# Patient Record
Sex: Female | Born: 1955
Health system: Southern US, Community
[De-identification: ages and names within clinical notes are randomized; demographics above are authoritative.]

## PROBLEM LIST (undated history)

## (undated) DIAGNOSIS — N6452 Nipple discharge: Secondary | ICD-10-CM

## (undated) DIAGNOSIS — K219 Gastro-esophageal reflux disease without esophagitis: Secondary | ICD-10-CM

## (undated) DIAGNOSIS — E739 Lactose intolerance, unspecified: Secondary | ICD-10-CM

## (undated) DIAGNOSIS — M797 Fibromyalgia: Secondary | ICD-10-CM

## (undated) DIAGNOSIS — E785 Hyperlipidemia, unspecified: Principal | ICD-10-CM

## (undated) HISTORY — DX: Nipple discharge: N64.52

## (undated) HISTORY — DX: Hyperlipidemia, unspecified: E78.5

## (undated) HISTORY — DX: Fibromyalgia: M79.7

## (undated) HISTORY — DX: Lactose intolerance, unspecified: E73.9

## (undated) HISTORY — DX: Gastro-esophageal reflux disease without esophagitis: K21.9

## (undated) HISTORY — PX: TONSILECTOMY, ADENOIDECTOMY, BILATERAL MYRINGOTOMY AND TUBES: SHX2538

## (undated) HISTORY — PX: BREAST SURGERY: SHX581

## (undated) HISTORY — PX: HEMANGIOMA EXCISION: SHX1734

## (undated) HISTORY — PX: LIVER BIOPSY: SHX301

## (undated) HISTORY — PX: OTHER SURGICAL HISTORY: SHX169

---

## 2008-09-22 HISTORY — PX: BREAST SURGERY: SHX581

## 2012-01-27 LAB — HM COLONOSCOPY

## 2014-06-07 LAB — HM PAP SMEAR: HM PAP: NEGATIVE

## 2015-10-01 ENCOUNTER — Ambulatory Visit: Payer: Self-pay | Admitting: Family Medicine

## 2015-10-09 ENCOUNTER — Ambulatory Visit (INDEPENDENT_AMBULATORY_CARE_PROVIDER_SITE_OTHER): Payer: Commercial Managed Care - HMO | Admitting: Family Medicine

## 2015-10-09 ENCOUNTER — Encounter: Payer: Self-pay | Admitting: Family Medicine

## 2015-10-09 VITALS — BP 160/88 | HR 74 | Ht 64.5 in | Wt 168.0 lb

## 2015-10-09 DIAGNOSIS — E559 Vitamin D deficiency, unspecified: Secondary | ICD-10-CM | POA: Insufficient documentation

## 2015-10-09 DIAGNOSIS — R413 Other amnesia: Secondary | ICD-10-CM | POA: Diagnosis not present

## 2015-10-09 DIAGNOSIS — E785 Hyperlipidemia, unspecified: Secondary | ICD-10-CM

## 2015-10-09 HISTORY — DX: Hyperlipidemia, unspecified: E78.5

## 2015-10-09 LAB — VITAMIN B12: Vitamin B-12: 329 pg/mL (ref 211–911)

## 2015-10-09 LAB — TSH: TSH: 1.887 u[IU]/mL (ref 0.350–4.500)

## 2015-10-09 NOTE — Addendum Note (Signed)
Addended by: Marcial Pacas on: 10/09/2015 10:07 AM   Modules accepted: Orders

## 2015-10-09 NOTE — Progress Notes (Signed)
CC: Amanda Novak is a 60 y.o. female is here for Establish Care   Subjective: HPI:     Amanda Novak 60 year old here to establish care, mother of Amanda Novak  Complains of memory loss described further as long-term memory loss. She gives examples like Forgetting most memories that she has of her children's childhood. She denies any short-term memory loss. Family history significant for mother with Alzheimer's disease. Patient reports symptoms are currently moderate in severity present on a daily basis. She denies any recent or remote head trauma. No interventions as of yet. Symptoms have been present for the last years. Nothing seems to make symptoms better or worse.  She is remote history of vitamin D deficiency. She is currently taking 5000 units of vitamin D on a daily basis. She also tells me she has a history of vitamin B12 deficiency. Review systems is positive for tingling and itching in both lower extremities for matter of years to a mild degree. Symptoms have not improved with compression stockings. She denies any anxiety or depression or any other mental disturbance.  Review of Systems - General ROS: negative for - chills, fever, night sweats, weight gain or weight loss Ophthalmic ROS: negative for - decreased vision Psychological ROS: negative for - anxiety or depression ENT ROS: negative for - hearing change, nasal congestion, tinnitus or allergies Hematological and Lymphatic ROS: negative for - bleeding problems, bruising or swollen lymph nodes Breast ROS: negative Respiratory ROS: no cough, shortness of breath, or wheezing Cardiovascular ROS: no chest pain or dyspnea on exertion Gastrointestinal ROS: no abdominal pain, change in bowel habits, or black or bloody stools Genito-Urinary ROS: negative for - genital discharge, genital ulcers, incontinence or abnormal bleeding from genitals Musculoskeletal ROS: negative for - joint pain or muscle pain Neurological ROS: negative for  - headaches Dermatological ROS: negative for lumps, mole changes, rash and skin lesion changes  Past Medical History  Diagnosis Date  . Hyperlipidemia 10/09/2015    No past surgical history on file. No family history on file.  Social History   Social History  . Marital Status: Married    Spouse Name: N/A  . Number of Children: N/A  . Years of Education: N/A   Occupational History  . Not on file.   Social History Main Topics  . Smoking status: Never Smoker   . Smokeless tobacco: Not on file  . Alcohol Use: Not on file  . Drug Use: Not on file  . Sexual Activity: Not on file   Other Topics Concern  . Not on file   Social History Narrative  . No narrative on file     Objective: BP 160/88 mmHg  Pulse 74  Ht 5' 4.5" (1.638 m)  Wt 168 lb (76.204 kg)  BMI 28.40 kg/m2  SpO2 96%  Vital signs reviewed. General: Alert and Oriented, No Acute Distress HEENT: Pupils equal, round, reactive to light. Conjunctivae clear.  External ears unremarkable.  Moist mucous membranes. Lungs: Clear and comfortable work of breathing, speaking in full sentences without accessory muscle use. Cardiac: Regular rate and rhythm.  Neuro: CN II-XII grossly intact, gait normal. Extremities: No peripheral edema.  Strong peripheral pulses.  Mental Status: No depression, anxiety, nor agitation. Logical though process. Skin: Warm and dry.  MMSE score of 29/30, she got Tuesday and Wednesday mixed up.  Assessment & Plan: Danina was seen today for establish care.  Diagnoses and all orders for this visit:  Hyperlipidemia -     Vitamin B12 -  TSH -     Hemoglobin A1c -     VITAMIN D 25 Hydroxy (Vit-D Deficiency, Fractures)  Vitamin D deficiency -     Vitamin B12 -     TSH -     Hemoglobin A1c -     VITAMIN D 25 Hydroxy (Vit-D Deficiency, Fractures)  Memory loss -     Vitamin B12 -     TSH -     Hemoglobin A1c -     VITAMIN D 25 Hydroxy (Vit-D Deficiency, Fractures)   Hyperlipidemia:  She is due for repeat lipid panel however is not fasting today she'll need to do this at upcoming CPE. Vitamin D deficiency: checking vitamin D today. Memory loss: Low suspicion for Alzheimer's disease given that her memory loss is more long-term as opposed to short-term memory loss. Checking for metabolic abnormalities with the labs above. She has a history of hyperglycemia.   Return if symptoms worsen or fail to improve.

## 2015-10-10 ENCOUNTER — Telehealth: Payer: Self-pay | Admitting: Family Medicine

## 2015-10-10 DIAGNOSIS — R7303 Prediabetes: Secondary | ICD-10-CM

## 2015-10-10 DIAGNOSIS — E538 Deficiency of other specified B group vitamins: Secondary | ICD-10-CM

## 2015-10-10 LAB — LIPID PANEL
CHOLESTEROL: 262 mg/dL — AB (ref 125–200)
HDL: 42 mg/dL — AB (ref >=46)
LDL Cholesterol: 182 mg/dL — ABNORMAL HIGH (ref <130)
TRIGLYCERIDES: 190 mg/dL — AB (ref <150)
Total CHOL/HDL Ratio: 6.2 Ratio — ABNORMAL HIGH (ref <=5.0)
VLDL: 38 mg/dL — ABNORMAL HIGH (ref <30)

## 2015-10-10 LAB — VITAMIN D 25 HYDROXY (VIT D DEFICIENCY, FRACTURES): VIT D 25 HYDROXY: 38 ng/mL (ref 30–100)

## 2015-10-10 LAB — HEMOGLOBIN A1C
Hgb A1c MFr Bld: 6 % — ABNORMAL HIGH (ref <5.7)
Mean Plasma Glucose: 126 mg/dL — ABNORMAL HIGH (ref <117)

## 2015-10-10 MED ORDER — VITAMIN D (ERGOCALCIFEROL) 1.25 MG (50000 UNIT) PO CAPS: 50000.0000 [IU] | ORAL_CAPSULE | ORAL | Status: DC

## 2015-10-10 NOTE — Telephone Encounter (Signed)
Pt advised.

## 2015-10-10 NOTE — Telephone Encounter (Signed)
Will you please let patient know that her Vitamin D and B12 levels were both relatively low. I would recommend starting a weekly vitamin D supplement that I'll print off for faxing or pickup.  Also I'd recommend a monthly Vitamin B12 injection that she can receive via a nurse visit (added to med list).  Also her 3 month average blood sugar was in the pre-diabetes range.  Fortunately this does not require any new medication but it warrants cutting back on carbohydrates and rechecking it in three months.

## 2015-10-11 ENCOUNTER — Telehealth: Payer: Self-pay | Admitting: Family Medicine

## 2015-10-11 MED ORDER — ATORVASTATIN CALCIUM 20 MG PO TABS: 20.0000 mg | ORAL_TABLET | Freq: Every day | ORAL | Status: DC

## 2015-10-11 NOTE — Telephone Encounter (Signed)
Pt notified  Pt reports that she was once told that she have a fatty liver.

## 2015-10-11 NOTE — Telephone Encounter (Signed)
Will you please let patient know that her LDL was very elevated at 182 with a goal of less than 100.  I'd recommend starting a cholesterol lowering medication called atorvastatin that I'll print off. We'll want to recheck this in three months.

## 2015-10-15 LAB — HEPATIC FUNCTION PANEL
ALT: 37 U/L — AB (ref 7–35)
AST: 27 U/L (ref 13–35)
Alkaline Phosphatase: 159 U/L — AB (ref 25–125)

## 2015-10-15 LAB — CBC AND DIFFERENTIAL: HEMOGLOBIN: 13.6 g/dL (ref 12.0–16.0)

## 2015-10-16 ENCOUNTER — Ambulatory Visit: Payer: Commercial Managed Care - HMO

## 2015-10-17 ENCOUNTER — Ambulatory Visit (INDEPENDENT_AMBULATORY_CARE_PROVIDER_SITE_OTHER): Payer: Commercial Managed Care - HMO | Admitting: Family Medicine

## 2015-10-17 VITALS — BP 141/98 | HR 65 | Wt 166.0 lb

## 2015-10-17 DIAGNOSIS — E538 Deficiency of other specified B group vitamins: Secondary | ICD-10-CM

## 2015-10-17 DIAGNOSIS — Z299 Encounter for prophylactic measures, unspecified: Secondary | ICD-10-CM

## 2015-10-17 MED ORDER — CYANOCOBALAMIN 1000 MCG/ML IJ SOLN
1000.0000 ug | Freq: Once | INTRAMUSCULAR | Status: AC
  Administered 2015-10-17: 1000 ug via INTRAMUSCULAR

## 2015-10-17 NOTE — Progress Notes (Signed)
Patient was in for B12 injection. 1 ml Cyanocobalamin was given left deltoid. Patient only had a complaint with her blood pressure being high the last several visits she stated that she will make an appointment to follow up on it. Please advise also patient stated that she is not taking Lipitor at this time. Angelyna Henderson,CMA

## 2015-11-15 ENCOUNTER — Encounter: Payer: Self-pay | Admitting: Family Medicine

## 2015-11-15 DIAGNOSIS — K219 Gastro-esophageal reflux disease without esophagitis: Secondary | ICD-10-CM | POA: Insufficient documentation

## 2015-11-15 DIAGNOSIS — K7581 Nonalcoholic steatohepatitis (NASH): Secondary | ICD-10-CM | POA: Insufficient documentation

## 2015-11-15 DIAGNOSIS — D1809 Hemangioma of other sites: Secondary | ICD-10-CM | POA: Insufficient documentation

## 2015-11-19 ENCOUNTER — Encounter: Payer: Self-pay | Admitting: Family Medicine

## 2015-11-19 ENCOUNTER — Ambulatory Visit (INDEPENDENT_AMBULATORY_CARE_PROVIDER_SITE_OTHER): Payer: Commercial Managed Care - HMO | Admitting: Family Medicine

## 2015-11-19 VITALS — BP 145/87 | HR 59 | Wt 168.0 lb

## 2015-11-19 DIAGNOSIS — E785 Hyperlipidemia, unspecified: Secondary | ICD-10-CM

## 2015-11-19 DIAGNOSIS — Z0000 Encounter for general adult medical examination without abnormal findings: Secondary | ICD-10-CM | POA: Diagnosis not present

## 2015-11-19 MED ORDER — GABAPENTIN 400 MG PO CAPS: 400.0000 mg | ORAL_CAPSULE | Freq: Three times a day (TID) | ORAL | Status: DC

## 2015-11-19 MED ORDER — CYANOCOBALAMIN 1000 MCG/ML IJ SOLN
1000.0000 ug | Freq: Once | INTRAMUSCULAR | Status: AC
  Administered 2015-11-19: 1000 ug via INTRAMUSCULAR

## 2015-11-19 NOTE — Progress Notes (Signed)
CC: [name] is a 60 y.o. female is here for Annual Exam   Subjective: HPI:  Colonoscopyup-to-date Papsmear:Normal 2015 Mammogram: Repeat June 2017  Influenza Vaccine: Up-to-date Pneumovax: no current medication Td/Tdap: UTD from 2014 Zoster: (Start 59 yo)   Requesting complete physical exam with no complaints  Review of Systems - General ROS: negative for - chills, fever, night sweats, weight gain or weight loss Ophthalmic ROS: negative for - decreased vision Psychological ROS: negative for - anxiety or depression ENT ROS: negative for - hearing change, nasal congestion, tinnitus or allergies Hematological and Lymphatic ROS: negative for - bleeding problems, bruising or swollen lymph nodes Breast ROS: negative Respiratory ROS: no cough, shortness of breath, or wheezing Cardiovascular ROS: no chest pain or dyspnea on exertion Gastrointestinal ROS: no abdominal pain, change in bowel habits, or black or bloody stools Genito-Urinary ROS: negative for - genital discharge, genital ulcers, incontinence or abnormal bleeding from genitals Musculoskeletal ROS: negative for - joint pain or muscle pain Neurological ROS: negative for - headaches or memory loss Dermatological ROS: negative for lumps, mole changes, rash and skin lesion changes  Past Medical History  Diagnosis Date  . Hyperlipidemia 10/09/2015  . Fibromyalgia   . GERD (gastroesophageal reflux disease)   . Lactose intolerance   . Nipple discharge     Past Surgical History  Procedure Laterality Date  . Tubal lig    . Breast surgery     No family history on file.  Social History   Social History  . Marital Status: Married    Spouse Name: N/A  . Number of Children: N/A  . Years of Education: N/A   Occupational History  . Not on file.   Social History Main Topics  . Smoking status: Never Smoker   . Smokeless tobacco: Not on file  . Alcohol Use: Not on file  . Drug Use: Not on file  . Sexual Activity:  Not on file   Other Topics Concern  . Not on file   Social History Narrative     Objective: BP 145/87 mmHg  Pulse 59  Wt 168 lb (76.204 kg)  General: No Acute Distress HEENT: Atraumatic, normocephalic, conjunctivae normal without scleral icterus.  No nasal discharge, hearing grossly intact, TMs with good landmarks bilaterally with no middle ear abnormalities, posterior pharynx clear without oral lesions. Neck: Supple, trachea midline, no cervical nor supraclavicular adenopathy. Pulmonary: Clear to auscultation bilaterally without wheezing, rhonchi, nor rales. Cardiac: Regular rate and rhythm.  No murmurs, rubs, nor gallops. No peripheral edema.  2+ peripheral pulses bilaterally. Abdomen: Bowel sounds normal.  No masses.  Non-tender without rebound.  Negative Murphy's sign. MSK: Grossly intact, no signs of weakness.  Full strength throughout upper and lower extremities.  Full ROM in upper and lower extremities.  No midline spinal tenderness. Neuro: Gait unremarkable, CN II-XII grossly intact.  C5-C6 Reflex 2/4 Bilaterally, L4 Reflex 2/4 Bilaterally.  Cerebellar function intact. Skin: No rashes.multiple noninflamed seborrheic keratoses on the back Psych: Alert and oriented to person/place/time.  Thought process normal. No anxiety/depression.   Assessment & Plan: Amanda Novak was seen today for annual exam.  Diagnoses and all orders for this visit:  Hyperlipidemia  Annual physical exam -     CBC -     COMPLETE METABOLIC PANEL WITH GFR -     gabapentin (NEURONTIN) 400 MG capsule; Take 1 capsule (400 mg total) by mouth 3 (three) times daily.  Preventative health care -     COMPLETE METABOLIC PANEL WITH  GFR -     gabapentin (NEURONTIN) 400 MG capsule; Take 1 capsule (400 mg total) by mouth 3 (three) times daily. -     cyanocobalamin ((VITAMIN B-12)) injection 1,000 mcg; Inject 1 mL (1,000 mcg total) into the muscle once.   Healthy lifestyle interventions including but not limited to  regular exercise, a healthy low fat diet, moderation of salt intake, the dangers of tobacco/alcohol/recreational drug use, nutrition supplementation, and accident avoidance were discussed with the patient and a handout was provided for future reference.  Return in about 3 months (around 02/16/2016) for BP and Sugar.

## 2015-11-20 LAB — COMPLETE METABOLIC PANEL WITH GFR
ALBUMIN: 4.2 g/dL (ref 3.6–5.1)
ALK PHOS: 147 U/L — AB (ref 33–130)
ALT: 29 U/L (ref 6–29)
AST: 24 U/L (ref 10–35)
BUN: 14 mg/dL (ref 7–25)
CALCIUM: 9.5 mg/dL (ref 8.6–10.4)
CO2: 26 mmol/L (ref 20–31)
Chloride: 106 mmol/L (ref 98–110)
Creat: 0.93 mg/dL (ref 0.50–1.05)
GFR, EST NON AFRICAN AMERICAN: 68 mL/min (ref >=60)
GFR, Est African American: 78 mL/min (ref >=60)
Glucose, Bld: 85 mg/dL (ref 65–99)
POTASSIUM: 3.9 mmol/L (ref 3.5–5.3)
SODIUM: 142 mmol/L (ref 135–146)
Total Bilirubin: 0.7 mg/dL (ref 0.2–1.2)
Total Protein: 6.8 g/dL (ref 6.1–8.1)

## 2015-11-20 LAB — CBC
HCT: 36.7 % (ref 36.0–46.0)
HEMOGLOBIN: 12 g/dL (ref 12.0–15.0)
MCH: 29.6 pg (ref 26.0–34.0)
MCHC: 32.7 g/dL (ref 30.0–36.0)
MCV: 90.6 fL (ref 78.0–100.0)
MPV: 9.8 fL (ref 8.6–12.4)
Platelets: 298 10*3/uL (ref 150–400)
RBC: 4.05 MIL/uL (ref 3.87–5.11)
RDW: 14.1 % (ref 11.5–15.5)
WBC: 8.2 10*3/uL (ref 4.0–10.5)

## 2015-11-21 ENCOUNTER — Ambulatory Visit (INDEPENDENT_AMBULATORY_CARE_PROVIDER_SITE_OTHER): Payer: 59 | Admitting: Family Medicine

## 2015-11-21 VITALS — BP 133/64 | HR 64 | Wt 170.0 lb

## 2015-11-21 DIAGNOSIS — Z23 Encounter for immunization: Secondary | ICD-10-CM

## 2015-11-21 DIAGNOSIS — Z111 Encounter for screening for respiratory tuberculosis: Secondary | ICD-10-CM

## 2015-11-21 NOTE — Progress Notes (Signed)
Pt came into clinic today for PPD placement. Pt does have two forms that need to be completed by her PCP. Will put these in PCP's box to be ready for pick up with Pt returns for her PPD read. Pt tolerated PPD placement in left forearm well, no immediate complications. Pt scheduled her PPD read prior to leaving clinic today. No further questions.

## 2015-11-22 ENCOUNTER — Encounter: Payer: Self-pay | Admitting: Family Medicine

## 2015-11-22 ENCOUNTER — Telehealth: Payer: Self-pay

## 2015-11-22 NOTE — Telephone Encounter (Signed)
Opened in error

## 2015-11-23 ENCOUNTER — Ambulatory Visit (INDEPENDENT_AMBULATORY_CARE_PROVIDER_SITE_OTHER): Payer: 59 | Admitting: Family Medicine

## 2015-11-23 VITALS — BP 131/71 | HR 75

## 2015-11-23 DIAGNOSIS — Z111 Encounter for screening for respiratory tuberculosis: Secondary | ICD-10-CM

## 2015-11-23 LAB — TB SKIN TEST
Induration: 0 mm
TB SKIN TEST: NEGATIVE

## 2015-11-23 NOTE — Progress Notes (Signed)
   Subjective:    Patient ID: Amanda Novak, female    DOB: 31-Aug-1956, 60 y.o.   MRN: [id number]  HPI  Amanda Novak is here for a PPD reading.   Review of Systems     Objective:   Physical Exam        Assessment & Plan:  PPD - negative with 0 mm

## 2015-12-01 ENCOUNTER — Emergency Department
Admission: EM | Admit: 2015-12-01 | Discharge: 2015-12-01 | Disposition: A | Discharge status: Home / Self Care | Payer: 59 | Source: Home / Self Care | Attending: Family Medicine | Admitting: Family Medicine

## 2015-12-01 ENCOUNTER — Encounter: Payer: Self-pay | Admitting: Emergency Medicine

## 2015-12-01 DIAGNOSIS — J329 Chronic sinusitis, unspecified: Secondary | ICD-10-CM

## 2015-12-01 DIAGNOSIS — J069 Acute upper respiratory infection, unspecified: Secondary | ICD-10-CM

## 2015-12-01 LAB — POCT INFLUENZA A/B
Influenza A, POC: NEGATIVE
Influenza B, POC: NEGATIVE

## 2015-12-01 MED ORDER — AMOXICILLIN-POT CLAVULANATE 875-125 MG PO TABS: 1.0000 | ORAL_TABLET | Freq: Two times a day (BID) | ORAL | Status: DC

## 2015-12-01 NOTE — ED Notes (Signed)
Patient presents to [facility] requesting a flu test symptoms times 7 days. Cough cold congestions, headache dizziness, body aches, fatigue

## 2015-12-01 NOTE — ED Provider Notes (Signed)
CSN: [id number]     Arrival date & time 12/01/15  1046 History   First MD Initiated Contact with Patient 12/01/15 1111     Chief Complaint  Patient presents with  . Nasal Congestion  . Cough   (Consider location/radiation/quality/duration/timing/severity/associated sxs/prior Treatment) HPI  The pt is a 60yo female presenting to [facility] with c/o flu-like symptoms for 7 days and wants to be tested for the flu.  She has been around several other people who have been sick.  She has had cough, congestion, generalized headache with dizziness, body aches, and fatigue.  Sinus pressure and congestion is most bothersome for her. Symptoms are moderate in severity. No relief with OTC tylenol. Denies difficulty breathing or swallowing. Denies n/vd.   Past Medical History  Diagnosis Date  . Hyperlipidemia 10/09/2015  . Fibromyalgia   . GERD (gastroesophageal reflux disease)   . Lactose intolerance   . Nipple discharge    Past Surgical History  Procedure Laterality Date  . Tubal lig    . Breast surgery     History reviewed. No pertinent family history. Social History  Substance Use Topics  . Smoking status: Never Smoker   . Smokeless tobacco: None  . Alcohol Use: None   OB History    No data available     Review of Systems  Constitutional: Negative for fever and chills.  HENT: Positive for congestion, postnasal drip, rhinorrhea, sinus pressure, sneezing and sore throat. Negative for ear pain, trouble swallowing and voice change.   Respiratory: Positive for cough. Negative for shortness of breath.   Cardiovascular: Negative for chest pain and palpitations.  Gastrointestinal: Negative for nausea, vomiting, abdominal pain and diarrhea.  Musculoskeletal: Positive for myalgias and arthralgias. Negative for back pain.  Skin: Negative for rash.  Neurological: Positive for dizziness and headaches. Negative for light-headedness.    Allergies  Tramadol; Codeine; and Hydrocodone  Home Medications    Prior to Admission medications   Medication Sig Start Date End Date Taking? Authorizing Provider  amoxicillin-clavulanate (AUGMENTIN) 875-125 MG tablet Take 1 tablet by mouth 2 (two) times daily. One po bid x 7 days 12/01/15   Noland Fordyce, PA-C  atorvastatin (LIPITOR) 20 MG tablet Take 1 tablet (20 mg total) by mouth daily. 10/11/15   Sean Hommel, DO  cyanocobalamin (,VITAMIN B-12,) 1000 MCG/ML injection 1037mcg injection IM every month in the clinic. 10/10/15   Marcial Pacas, DO  esomeprazole (NEXIUM) 40 MG capsule  05/08/14   Historical Provider, MD  gabapentin (NEURONTIN) 400 MG capsule Take 1 capsule (400 mg total) by mouth 3 (three) times daily. 11/19/15   Marcial Pacas, DO  neomycin-polymyxin b-dexamethasone (MAXITROL) 3.5-10000-0.1 SUSP  11/14/15   Historical Provider, MD  pantoprazole (PROTONIX) 40 MG tablet Take 40 tablets by mouth daily. 11/15/15   Historical Provider, MD  Vitamin D, Ergocalciferol, (DRISDOL) 50000 units CAPS capsule Take 1 capsule (50,000 Units total) by mouth every 7 (seven) days. Recheck Vitamin D in 3 Months 10/10/15   Marcial Pacas, DO   Meds Ordered and Administered this Visit  Medications - No data to display  BP 161/85 mmHg  Pulse 83  Temp(Src) 98.3 F (36.8 C) (Oral)  Resp 16  Ht 5\' 4"  (1.626 m)  Wt 169 lb 12 oz (76.998 kg)  BMI 29.12 kg/m2  SpO2 96% No data found.   Physical Exam  Constitutional: She appears well-developed and well-nourished. No distress.  HENT:  Head: Normocephalic and atraumatic.  Right Ear: A middle ear effusion is present.  Left Ear: Tympanic membrane normal.  Nose: Mucosal edema and rhinorrhea present. Right sinus exhibits maxillary sinus tenderness and frontal sinus tenderness. Left sinus exhibits maxillary sinus tenderness and frontal sinus tenderness.  Mouth/Throat: Uvula is midline, oropharynx is clear and moist and mucous membranes are normal.  Eyes: Conjunctivae are normal. No scleral icterus.  Neck: Normal range of motion.  Neck supple.  Cardiovascular: Normal rate, regular rhythm and normal heart sounds.   Pulmonary/Chest: Effort normal and breath sounds normal. No respiratory distress. She has no wheezes. She has no rales. She exhibits no tenderness.  Abdominal: Soft. She exhibits no distension. There is no tenderness.  Musculoskeletal: Normal range of motion.  Neurological: She is alert.  Skin: Skin is warm and dry. She is not diaphoretic.  Nursing note and vitals reviewed.   ED Course  Procedures (including critical care time)  Labs Review Labs Reviewed - No data to display  Imaging Review No results found.    MDM   1. Rhinosinusitis   2. Acute upper respiratory infection    Pt c/o flu-like symptoms for 1 week, worsening sinus pressure and pain.  Rapid flu- negative  Exam c/w sinus infection. Will tx with antibiotics given severity of pain. Rx: augmentin  Advised pt to use acetaminophen and ibuprofen as needed for fever and pain. Encouraged rest and fluids. F/u with PCP in 7-10 days if not improving, sooner if worsening. Pt verbalized understanding and agreement with tx plan.    Noland Fordyce, PA-C 12/01/15 1252

## 2015-12-01 NOTE — Discharge Instructions (Signed)
You may take 400-600mg  Ibuprofen (Motrin) every 6-8 hours for fever and pain  Alternate with Tylenol  You may take 500mg  Tylenol every 4-6 hours as needed for fever and pain  Follow-up with your primary care provider next week for recheck of symptoms if not improving.  Be sure to drink plenty of fluids and rest, at least 8hrs of sleep a night, preferably more while you are sick. Return urgent care or go to closest ER if you cannot keep down fluids/signs of dehydration, fever not reducing with Tylenol, difficulty breathing/wheezing, stiff neck, worsening condition, or other concerns (see below)  Please take antibiotics as prescribed and be sure to complete entire course even if you start to feel better to ensure infection does not come back.   Cool Mist Vaporizers Vaporizers may help relieve the symptoms of a cough and cold. They add moisture to the air, which helps mucus to become thinner and less sticky. This makes it easier to breathe and cough up secretions. Cool mist vaporizers do not cause serious burns like hot mist vaporizers, which may also be called steamers or humidifiers. Vaporizers have not been proven to help with colds. You should not use a vaporizer if you are allergic to mold. HOME CARE INSTRUCTIONS  Follow the package instructions for the vaporizer.  Do not use anything other than distilled water in the vaporizer.  Do not run the vaporizer all of the time. This can cause mold or bacteria to grow in the vaporizer.  Clean the vaporizer after each time it is used.  Clean and dry the vaporizer well before storing it.  Stop using the vaporizer if worsening respiratory symptoms develop.   This information is not intended to replace advice given to you by your health care provider. Make sure you discuss any questions you have with your health care provider.   Document Released: 06/05/2004 Document Revised: 09/13/2013 Document Reviewed: 01/26/2013 Elsevier Interactive Patient  Education 2016 [employer].  Sinusitis, Adult Sinusitis is redness, soreness, and puffiness (inflammation) of the air pockets in the bones of your face (sinuses). The redness, soreness, and puffiness can cause air and mucus to get trapped in your sinuses. This can allow germs to grow and cause an infection.  HOME CARE   Drink enough fluids to keep your pee (urine) clear or pale yellow.  Use a humidifier in your home.  Run a hot shower to create steam in the bathroom. Sit in the bathroom with the door closed. Breathe in the steam 3-4 times a day.  Put a warm, moist washcloth on your face 3-4 times a day, or as told by your doctor.  Use salt water sprays (saline sprays) to wet the thick fluid in your nose. This can help the sinuses drain.  Only take medicine as told by your doctor. GET HELP RIGHT AWAY IF:   Your pain gets worse.  You have very bad headaches.  You are sick to your stomach (nauseous).  You throw up (vomit).  You are very sleepy (drowsy) all the time.  Your face is puffy (swollen).  Your vision changes.  You have a stiff neck.  You have trouble breathing. MAKE SURE YOU:   Understand these instructions.  Will watch your condition.  Will get help right away if you are not doing well or get worse.   This information is not intended to replace advice given to you by your health care provider. Make sure you discuss any questions you have with your health  care provider.   Document Released: 02/25/2008 Document Revised: 09/29/2014 Document Reviewed: 04/13/2012 Elsevier Interactive Patient Education [employer].

## 2015-12-17 ENCOUNTER — Ambulatory Visit (INDEPENDENT_AMBULATORY_CARE_PROVIDER_SITE_OTHER): Payer: 59 | Admitting: Family Medicine

## 2015-12-17 ENCOUNTER — Ambulatory Visit (INDEPENDENT_AMBULATORY_CARE_PROVIDER_SITE_OTHER): Payer: 59

## 2015-12-17 ENCOUNTER — Encounter: Payer: Self-pay | Admitting: Family Medicine

## 2015-12-17 VITALS — BP 147/92 | HR 64 | Temp 98.3°F | Wt 170.0 lb

## 2015-12-17 DIAGNOSIS — D539 Nutritional anemia, unspecified: Secondary | ICD-10-CM

## 2015-12-17 DIAGNOSIS — R0782 Intercostal pain: Secondary | ICD-10-CM

## 2015-12-17 DIAGNOSIS — R05 Cough: Secondary | ICD-10-CM

## 2015-12-17 DIAGNOSIS — R059 Cough, unspecified: Secondary | ICD-10-CM

## 2015-12-17 MED ORDER — PREDNISONE 20 MG PO TABS: ORAL_TABLET | ORAL | Status: AC

## 2015-12-17 MED ORDER — LEVOFLOXACIN 500 MG PO TABS: 500.0000 mg | ORAL_TABLET | Freq: Every day | ORAL | Status: DC

## 2015-12-17 MED ORDER — CYANOCOBALAMIN 1000 MCG/ML IJ SOLN
1000.0000 ug | Freq: Once | INTRAMUSCULAR | Status: AC
  Administered 2015-12-17: 1000 ug via INTRAMUSCULAR

## 2015-12-17 NOTE — Progress Notes (Signed)
CC: Amanda Novak is a 60 y.o. female is here for Sinusitis   Subjective: HPI:  Cough ever since the beginning of March it's mildly productive, there is no blood in the sputum. It's been worsening on a weekly basis despite getting Augmentin back on the 11th for treatment of suspected sinusitis. She continues to have nasal congestion, postnasal drip and frequent coughing. Chest pain occurs while coughing but absent when resting. There is no other exertional component to her chest pain. She's had some wheezing when lying down but no subjective rhonchi. She denies shortness of breath. She had fever over the weekend but was controlled with Tylenol. No benefit from Allegra-D, Zyrtec, nor Augmentin.she had diarrhea but stopped after she stopped Augmentin   Review Of Systems Outlined In HPI  Past Medical History  Diagnosis Date  . Hyperlipidemia 10/09/2015  . Fibromyalgia   . GERD (gastroesophageal reflux disease)   . Lactose intolerance   . Nipple discharge     Past Surgical History  Procedure Laterality Date  . Tubal lig    . Breast surgery     No family history on file.  Social History   Social History  . Marital Status: Married    Spouse Name: N/A  . Number of Children: N/A  . Years of Education: N/A   Occupational History  . Not on file.   Social History Main Topics  . Smoking status: Never Smoker   . Smokeless tobacco: Not on file  . Alcohol Use: Not on file  . Drug Use: Not on file  . Sexual Activity: Not on file   Other Topics Concern  . Not on file   Social History Narrative     Objective: BP 147/92 mmHg  Pulse 64  Temp(Src) 98.3 F (36.8 C) (Oral)  Wt 170 lb (77.111 kg)  SpO2 99%  General: Alert and Oriented, No Acute Distress HEENT: Pupils equal, round, reactive to light. Conjunctivae clear.  External ears unremarkable, canals clear with intact TMs with appropriate landmarks.  Middle ear appears open without effusion. Pink inferior turbinates.  Moist  mucous membranes, pharynx without inflammation nor lesions.  Neck supple without palpable lymphadenopathy nor abnormal masses. Lungs: Clear to auscultation bilaterally, no wheezing/ronchi/rales.  Comfortable work of breathing. Good air movement. Cardiac: Regular rate and rhythm. Normal S1/S2.  No murmurs, rubs, nor gallops.   Extremities: No peripheral edema.  Strong peripheral pulses.  Mental Status: No depression, anxiety, nor agitation. Skin: Warm and dry.  Assessment & Plan: Amanda Novak was seen today for sinusitis.  Diagnoses and all orders for this visit:  Cough -     DG Chest 2 View  Intercostal pain -     DG Chest 2 View   X-RAY OBTAINED AND WAS REASSURING AND UNREMARKABLE. pATIENT IS IN AGREEMENT WITH TREATMENT PLAN INCLUDING COMBINATION OF PREDNISONE AND lEVAQUIN. i'VE ASKED HER TO CALL ME IF SHE IS NOT FEELING BETTER BY wEDNESDAY.   No Follow-up on file.

## 2015-12-18 ENCOUNTER — Ambulatory Visit: Payer: 59

## 2015-12-19 ENCOUNTER — Other Ambulatory Visit: Payer: Self-pay

## 2015-12-19 MED ORDER — VITAMIN D (ERGOCALCIFEROL) 1.25 MG (50000 UNIT) PO CAPS: 50000.0000 [IU] | ORAL_CAPSULE | ORAL | Status: DC

## 2015-12-27 ENCOUNTER — Encounter: Payer: Self-pay | Admitting: Family Medicine

## 2015-12-27 ENCOUNTER — Ambulatory Visit (INDEPENDENT_AMBULATORY_CARE_PROVIDER_SITE_OTHER): Payer: 59 | Admitting: Family Medicine

## 2015-12-27 VITALS — BP 129/57 | HR 77 | Temp 99.1°F | Ht 64.25 in | Wt 169.0 lb

## 2015-12-27 DIAGNOSIS — R300 Dysuria: Secondary | ICD-10-CM | POA: Diagnosis not present

## 2015-12-27 DIAGNOSIS — N760 Acute vaginitis: Secondary | ICD-10-CM | POA: Diagnosis not present

## 2015-12-27 DIAGNOSIS — R6889 Other general symptoms and signs: Secondary | ICD-10-CM | POA: Diagnosis not present

## 2015-12-27 DIAGNOSIS — R05 Cough: Secondary | ICD-10-CM | POA: Diagnosis not present

## 2015-12-27 DIAGNOSIS — R0981 Nasal congestion: Secondary | ICD-10-CM

## 2015-12-27 DIAGNOSIS — R55 Syncope and collapse: Secondary | ICD-10-CM

## 2015-12-27 DIAGNOSIS — R748 Abnormal levels of other serum enzymes: Secondary | ICD-10-CM | POA: Diagnosis not present

## 2015-12-27 DIAGNOSIS — R059 Cough, unspecified: Secondary | ICD-10-CM

## 2015-12-27 LAB — POCT URINALYSIS DIPSTICK
BILIRUBIN UA: NEGATIVE
Blood, UA: NEGATIVE
Glucose, UA: NEGATIVE
KETONES UA: NEGATIVE
NITRITE UA: NEGATIVE
PROTEIN UA: NEGATIVE
Spec Grav, UA: 1.015
Urobilinogen, UA: 0.2
pH, UA: 7

## 2015-12-27 MED ORDER — BENZONATATE 200 MG PO CAPS: 200.0000 mg | ORAL_CAPSULE | Freq: Two times a day (BID) | ORAL | Status: DC | PRN

## 2015-12-27 MED ORDER — PREDNISONE 20 MG PO TABS: ORAL_TABLET | ORAL | Status: DC
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
## 2015-12-28 ENCOUNTER — Encounter: Payer: Self-pay | Admitting: Family Medicine

## 2015-12-28 LAB — WET PREP, GENITAL
TRICH WET PREP: NONE SEEN
Yeast Wet Prep HPF POC: NONE SEEN

## 2015-12-28 LAB — CBC WITH DIFFERENTIAL/PLATELET
BASOS ABS: 0 {cells}/uL (ref 0–200)
Basophils Relative: 0 %
EOS ABS: 61 {cells}/uL (ref 15–500)
Eosinophils Relative: 1 %
HEMATOCRIT: 39.4 % (ref 35.0–45.0)
HEMOGLOBIN: 13 g/dL (ref 11.7–15.5)
Lymphocytes Relative: 44 %
Lymphs Abs: 2684 cells/uL (ref 850–3900)
MCH: 29.2 pg (ref 27.0–33.0)
MCHC: 33 g/dL (ref 32.0–36.0)
MCV: 88.5 fL (ref 80.0–100.0)
MONO ABS: 549 {cells}/uL (ref 200–950)
MONOS PCT: 9 %
MPV: 10 fL (ref 7.5–12.5)
Neutro Abs: 2806 cells/uL (ref 1500–7800)
Neutrophils Relative %: 46 %
PLATELETS: 211 10*3/uL (ref 140–400)
RBC: 4.45 MIL/uL (ref 3.80–5.10)
RDW: 14.1 % (ref 11.0–15.0)
WBC: 6.1 10*3/uL (ref 3.8–10.8)

## 2015-12-28 LAB — COMPLETE METABOLIC PANEL WITH GFR
ALT: 28 U/L (ref 6–29)
AST: 20 U/L (ref 10–35)
Albumin: 4 g/dL (ref 3.6–5.1)
Alkaline Phosphatase: 117 U/L (ref 33–130)
BILIRUBIN TOTAL: 0.9 mg/dL (ref 0.2–1.2)
BUN: 14 mg/dL (ref 7–25)
CHLORIDE: 103 mmol/L (ref 98–110)
CO2: 25 mmol/L (ref 20–31)
CREATININE: 0.91 mg/dL (ref 0.50–1.05)
Calcium: 8.8 mg/dL (ref 8.6–10.4)
GFR, EST AFRICAN AMERICAN: 80 mL/min (ref >=60)
GFR, Est Non African American: 70 mL/min (ref >=60)
GLUCOSE: 87 mg/dL (ref 65–99)
Potassium: 3.9 mmol/L (ref 3.5–5.3)
SODIUM: 140 mmol/L (ref 135–146)
TOTAL PROTEIN: 6.7 g/dL (ref 6.1–8.1)

## 2015-12-28 LAB — GAMMA GT: GGT: 43 U/L (ref 7–51)

## 2015-12-28 MED ORDER — METRONIDAZOLE 500 MG PO TABS: 500.0000 mg | ORAL_TABLET | Freq: Two times a day (BID) | ORAL | Status: DC

## 2015-12-28 NOTE — Addendum Note (Signed)
Addended by: Beatrice Lecher D on: 12/28/2015 08:00 AM   Modules accepted: Orders

## 2015-12-29 LAB — URINE CULTURE
Colony Count: NO GROWTH
Organism ID, Bacteria: NO GROWTH

## 2016-01-01 ENCOUNTER — Telehealth: Payer: Self-pay

## 2016-01-01 NOTE — Telephone Encounter (Signed)
If she is feeling tremendously better then can hold off on CT of sinses.

## 2016-01-02 NOTE — Telephone Encounter (Signed)
Patient advised.

## 2016-01-23 ENCOUNTER — Ambulatory Visit (INDEPENDENT_AMBULATORY_CARE_PROVIDER_SITE_OTHER): Payer: Commercial Managed Care - HMO | Admitting: Family Medicine

## 2016-01-23 VITALS — BP 127/79 | HR 103

## 2016-01-23 DIAGNOSIS — D510 Vitamin B12 deficiency anemia due to intrinsic factor deficiency: Secondary | ICD-10-CM

## 2016-01-23 MED ORDER — CYANOCOBALAMIN 1000 MCG/ML IJ SOLN
1000.0000 ug | Freq: Once | INTRAMUSCULAR | Status: AC
Start: 2016-01-23 — End: 2016-01-23
  Administered 2016-01-23: 1000 ug via INTRAMUSCULAR

## 2016-01-23 NOTE — Progress Notes (Signed)
Amanda Novak is here for a vitamin B 12 injection. Denies muscle cramps, weakness or irregular heart rate.   Patient tolerated injection well without complications. Patient advised to schedule next injection 30 days from today.

## 2016-01-25 ENCOUNTER — Encounter: Payer: Self-pay | Admitting: Emergency Medicine

## 2016-01-25 ENCOUNTER — Emergency Department
Admission: EM | Admit: 2016-01-25 | Discharge: 2016-01-25 | Disposition: A | Discharge status: Home / Self Care | Payer: Commercial Managed Care - HMO | Source: Home / Self Care | Attending: Emergency Medicine | Admitting: Emergency Medicine

## 2016-01-25 DIAGNOSIS — J0120 Acute ethmoidal sinusitis, unspecified: Secondary | ICD-10-CM

## 2016-01-25 MED ORDER — PREDNISONE 20 MG PO TABS: ORAL_TABLET | ORAL | Status: DC

## 2016-01-25 MED ORDER — LEVOFLOXACIN 500 MG PO TABS: 500.0000 mg | ORAL_TABLET | Freq: Every day | ORAL | Status: DC

## 2016-01-25 NOTE — Discharge Instructions (Signed)

## 2016-01-25 NOTE — ED Notes (Signed)
Reports 7 week history of intermittent sinus problems; most recent episode started 4 days ago and involves the area anterior of left ear.

## 2016-01-25 NOTE — ED Provider Notes (Signed)
CSN: [id number]     Arrival date & time 01/25/16  1837 History   First MD Initiated Contact with Patient 01/25/16 1853     Chief Complaint  Patient presents with  . Facial Pain   (Consider location/radiation/quality/duration/timing/severity/associated sxs/prior Treatment) Patient is a 60 y.o. female presenting with cough. The history is provided by the patient. No language interpreter was used.  Cough Cough characteristics:  Productive Sputum characteristics:  Clear Severity:  Moderate Onset quality:  Gradual Duration:  4 days Timing:  Constant Progression:  Worsening Chronicity:  New Smoker: no   Relieved by:  Nothing Worsened by:  Nothing tried Ineffective treatments:  None tried Associated symptoms: ear pain, headaches, rhinorrhea and sinus congestion   Risk factors: recent infection   Pt reports she has had sinus infections on and off for 7 weeks.  Pt has been seen by Dr. Madilyn Fireman and Dr. Ileene Rubens.   Pt reports she was told she needed a ct but she got better.  Pt was last treated with prednisone and levaquin.    Past Medical History  Diagnosis Date  . Hyperlipidemia 10/09/2015  . Fibromyalgia   . GERD (gastroesophageal reflux disease)   . Lactose intolerance   . Nipple discharge    Past Surgical History  Procedure Laterality Date  . Tubal lig    . Breast surgery     History reviewed. No pertinent family history. Social History  Substance Use Topics  . Smoking status: Never Smoker   . Smokeless tobacco: None  . Alcohol Use: None   OB History    No data available     Review of Systems  HENT: Positive for ear pain and rhinorrhea.   Respiratory: Positive for cough.   Neurological: Positive for headaches.  All other systems reviewed and are negative.   Allergies  Tramadol; Codeine; Hydrocodone; and Zyrtec  Home Medications   Prior to Admission medications   Medication Sig Start Date End Date Taking? Authorizing Provider  atorvastatin (LIPITOR) 20 MG tablet  Take 1 tablet (20 mg total) by mouth daily. 10/11/15   Sean Hommel, DO  benzonatate (TESSALON) 200 MG capsule Take 1 capsule (200 mg total) by mouth 2 (two) times daily as needed for cough. 12/27/15   Hali Marry, MD  cyanocobalamin (,VITAMIN B-12,) 1000 MCG/ML injection 1076mg injection IM every month in the clinic. 10/10/15   Sean Hommel, DO  gabapentin (NEURONTIN) 400 MG capsule Take 1 capsule (400 mg total) by mouth 3 (three) times daily. 11/19/15   Sean Hommel, DO  levofloxacin (LEVAQUIN) 500 MG tablet Take 1 tablet (500 mg total) by mouth daily. 01/25/16   LFransico Meadow PA-C  metroNIDAZOLE (FLAGYL) 500 MG tablet Take 1 tablet (500 mg total) by mouth 2 (two) times daily. 12/28/15   CHali Marry MD  pantoprazole (PROTONIX) 40 MG tablet Take 40 tablets by mouth daily. 11/15/15   Historical Provider, MD  predniSONE (DELTASONE) 20 MG tablet 2 tabs PO QD x 5 days, then 1 tab PO QD x 5 days. 01/25/16   LFransico Meadow PA-C  Vitamin D, Ergocalciferol, (DRISDOL) 50000 units CAPS capsule Take 1 capsule (50,000 Units total) by mouth every 7 (seven) days. Recheck Vitamin D in 3 Months 12/19/15   SMarcial Pacas DO   Meds Ordered and Administered this Visit  Medications - No data to display  BP 131/71 mmHg  Pulse 79  Temp(Src) 98.4 F (36.9 C) (Oral)  Resp 16  Ht 5' 4"  (1.626 m)  Wt  169 lb (76.658 kg)  BMI 28.99 kg/m2  SpO2 98% No data found.   Physical Exam  Constitutional: She is oriented to person, place, and time. She appears well-developed and well-nourished.  HENT:  Head: Normocephalic and atraumatic.  Right Ear: External ear normal.  Left Ear: External ear normal.  Nose: Nose normal.  Mouth/Throat: Oropharynx is clear and moist.  Tender maxillary sinuses,  Fluid  bilat tm's.  No erythema,    Eyes: Conjunctivae and EOM are normal. Pupils are equal, round, and reactive to light.  Neck: Normal range of motion. Neck supple.  Cardiovascular: Normal rate.   Pulmonary/Chest: Effort  normal.  Abdominal: Soft. She exhibits no distension.  Musculoskeletal: Normal range of motion.  Neurological: She is alert and oriented to person, place, and time.  Skin: Skin is warm and dry.  Psychiatric: She has a normal mood and affect.  Nursing note and vitals reviewed.   ED Course  Procedures (including critical care time)  Labs Review Labs Reviewed - No data to display  Imaging Review No results found.   Visual Acuity Review  Right Eye Distance:   Left Eye Distance:   Bilateral Distance:    Right Eye Near:   Left Eye Near:    Bilateral Near:         MDM Pt advised to call Dr. Lajoyce Lauber office on Monday to discuss Ct scan.  I will treat with levaquin and prednisone as this worked last time.   Pt as hemangioma's.  Pt concerned about hemagioma's and levaquin.  (Levaquin reported to have possible tendon rupture but no literature on effects to hemangioma's.)  Pt counseled on risk associated with Levaquin and all medications   1. Subacute ethmoidal sinusitis    Meds ordered this encounter  Medications  . DISCONTD: predniSONE (DELTASONE) 20 MG tablet    Sig: 2 tabs PO QD x 5 days, then 1 tab PO QD x 5 days.    Dispense:  15 tablet    Refill:  0    Order Specific Question:  Supervising Provider    Answer:  Burnett Harry, DAVID [[id number]]  . DISCONTD: levofloxacin (LEVAQUIN) 500 MG tablet    Sig: Take 1 tablet (500 mg total) by mouth daily.    Dispense:  10 tablet    Refill:  0    Order Specific Question:  Supervising Provider    Answer:  Burnett Harry, DAVID [[id number]]  . predniSONE (DELTASONE) 20 MG tablet    Sig: 2 tabs PO QD x 5 days, then 1 tab PO QD x 5 days.    Dispense:  15 tablet    Refill:  0    Order Specific Question:  Supervising Provider    Answer:  Burnett Harry, DAVID [[id number]]  . levofloxacin (LEVAQUIN) 500 MG tablet    Sig: Take 1 tablet (500 mg total) by mouth daily.    Dispense:  10 tablet    Refill:  0    Order Specific Question:  Supervising Provider    Answer:   Burnett Harry, DAVID [[id number]]  An After Visit Summary was printed and given to the patient.    Lucan, PA-C 01/25/16 1927

## 2016-01-27 ENCOUNTER — Telehealth: Payer: Self-pay | Admitting: Emergency Medicine

## 2016-02-04 ENCOUNTER — Ambulatory Visit (INDEPENDENT_AMBULATORY_CARE_PROVIDER_SITE_OTHER): Payer: Commercial Managed Care - HMO

## 2016-02-04 DIAGNOSIS — R0981 Nasal congestion: Secondary | ICD-10-CM

## 2016-02-04 DIAGNOSIS — R059 Cough, unspecified: Secondary | ICD-10-CM

## 2016-02-04 DIAGNOSIS — R05 Cough: Secondary | ICD-10-CM

## 2016-02-11 ENCOUNTER — Ambulatory Visit: Payer: 59 | Admitting: Family Medicine

## 2016-02-21 ENCOUNTER — Ambulatory Visit: Payer: Commercial Managed Care - HMO

## 2016-02-22 ENCOUNTER — Ambulatory Visit (INDEPENDENT_AMBULATORY_CARE_PROVIDER_SITE_OTHER): Payer: Commercial Managed Care - HMO | Admitting: Family Medicine

## 2016-02-22 VITALS — BP 137/71 | HR 82

## 2016-02-22 DIAGNOSIS — D510 Vitamin B12 deficiency anemia due to intrinsic factor deficiency: Secondary | ICD-10-CM

## 2016-02-22 MED ORDER — CYANOCOBALAMIN 1000 MCG/ML IJ SOLN
1000.0000 ug | Freq: Once | INTRAMUSCULAR | Status: AC
  Administered 2016-02-22: 1000 ug via INTRAMUSCULAR

## 2016-02-22 NOTE — Progress Notes (Signed)
Successful Injection

## 2016-04-10 ENCOUNTER — Encounter: Payer: Self-pay | Admitting: Family Medicine

## 2016-04-10 ENCOUNTER — Ambulatory Visit (INDEPENDENT_AMBULATORY_CARE_PROVIDER_SITE_OTHER): Payer: Commercial Managed Care - HMO | Admitting: Family Medicine

## 2016-04-10 VITALS — BP 122/69 | HR 62

## 2016-04-10 DIAGNOSIS — E538 Deficiency of other specified B group vitamins: Secondary | ICD-10-CM | POA: Diagnosis not present

## 2016-04-10 MED ORDER — CYANOCOBALAMIN 1000 MCG/ML IJ SOLN
1000.0000 ug | Freq: Once | INTRAMUSCULAR | Status: AC
  Administered 2016-04-10: 1000 ug via INTRAMUSCULAR

## 2016-04-10 NOTE — Progress Notes (Signed)
   Subjective:    Patient ID: Amanda Novak, female    DOB: Feb 17, 1956, 60 y.o.   MRN: [id number]  HPI  Shalae presents to clinic for her monthly b 12 injection.  Denies weakness, irregular heart rates, and muscle cramps.  Review of Systems     Objective:   Physical Exam        Assessment & Plan:  Pt tolerated injection in the right deltoid well without any complications. Pt advised to make next appointment in 30 days. -[facility]/RMA

## 2016-05-12 ENCOUNTER — Ambulatory Visit (INDEPENDENT_AMBULATORY_CARE_PROVIDER_SITE_OTHER): Payer: Commercial Managed Care - HMO | Admitting: Family Medicine

## 2016-05-12 VITALS — BP 120/81 | HR 70 | Ht 64.0 in | Wt 171.0 lb

## 2016-05-12 DIAGNOSIS — E538 Deficiency of other specified B group vitamins: Secondary | ICD-10-CM | POA: Diagnosis not present

## 2016-05-12 MED ORDER — CYANOCOBALAMIN 1000 MCG/ML IJ SOLN
1000.0000 ug | INTRAMUSCULAR | Status: DC
  Administered 2016-05-12: 1000 ug via INTRAMUSCULAR

## 2016-05-12 NOTE — Progress Notes (Signed)
Patient was in office for B 12 injection. 1 ml of Cyanocobalamin was given left deltoid. Patient denied any concerns or side effects. Patient scheduled appointment for next injection. Rhonda Cunningham,CMA

## 2016-06-09 ENCOUNTER — Encounter: Payer: Self-pay | Admitting: Physician Assistant

## 2016-06-09 ENCOUNTER — Ambulatory Visit (INDEPENDENT_AMBULATORY_CARE_PROVIDER_SITE_OTHER): Payer: Commercial Managed Care - HMO | Admitting: Physician Assistant

## 2016-06-09 VITALS — BP 127/63 | HR 78 | Temp 98.3°F | Wt 170.0 lb

## 2016-06-09 DIAGNOSIS — E538 Deficiency of other specified B group vitamins: Secondary | ICD-10-CM

## 2016-06-09 DIAGNOSIS — Z23 Encounter for immunization: Secondary | ICD-10-CM

## 2016-06-09 MED ORDER — CYANOCOBALAMIN 1000 MCG/ML IJ SOLN
1000.0000 ug | Freq: Once | INTRAMUSCULAR | Status: AC
  Administered 2016-06-09: 1000 ug via INTRAMUSCULAR

## 2016-06-09 NOTE — Progress Notes (Signed)
Patient is here for a B12 injection. B12 injection administered without complication in right deltoid.  Patient also requests a flu shot. She did mention she is coming for an appt tomorrow to be seen for a possible sinus infection but has not had a fever within the last 24 hours that she is aware of.

## 2016-06-10 ENCOUNTER — Ambulatory Visit (INDEPENDENT_AMBULATORY_CARE_PROVIDER_SITE_OTHER): Payer: Commercial Managed Care - HMO | Admitting: Physician Assistant

## 2016-06-10 ENCOUNTER — Encounter: Payer: Self-pay | Admitting: Physician Assistant

## 2016-06-10 VITALS — BP 131/75 | HR 80 | Ht 64.0 in | Wt 168.0 lb

## 2016-06-10 DIAGNOSIS — J0100 Acute maxillary sinusitis, unspecified: Secondary | ICD-10-CM

## 2016-06-10 MED ORDER — AMOXICILLIN-POT CLAVULANATE 875-125 MG PO TABS: 1.0000 | ORAL_TABLET | Freq: Two times a day (BID) | ORAL | 0 refills | Status: DC

## 2016-06-10 NOTE — Patient Instructions (Signed)

## 2016-06-10 NOTE — Progress Notes (Signed)
   Subjective:    Patient ID: Amanda Novak, female    DOB: Jul 20, 1956, 60 y.o.   MRN: [id number]  HPI Pt is a 60 yo female who presents to the clinic with over 7 days of sinus pressure, left ear pain, nasal congestion. She has had upper respiratory symptoms for over 2 weeks. She is taking sudafed and mucinex with some relief. No fever, chills, body aches. She has a dry cough.    Review of Systems See HPI>     Objective:   Physical Exam  Constitutional: She is oriented to person, place, and time. She appears well-developed and well-nourished.  HENT:  Head: Normocephalic and atraumatic.  Right Ear: External ear normal.  Left Ear: External ear normal.  Mouth/Throat: Oropharynx is clear and moist.  TM's clear.  Bilateral nasal turbinates red and swollen.  Tenderness over maxillary sinuses to palpation.   Eyes: Conjunctivae are normal. Right eye exhibits no discharge. Left eye exhibits no discharge.  Neck: Normal range of motion. Neck supple.  Non-tender shotty lymph node enlargement anterior cervical.   Cardiovascular: Normal rate, regular rhythm and normal heart sounds.   Pulmonary/Chest: Effort normal and breath sounds normal.  Lymphadenopathy:    She has cervical adenopathy.  Neurological: She is alert and oriented to person, place, and time.  Psychiatric: She has a normal mood and affect. Her behavior is normal.          Assessment & Plan:  Acute maxillary sinusitis- sent augmentin for 10 days. Discussed claritin for allergies. Discussed flonase as needed. Other symptomatic care given. If not improving by Friday will consider medrol dose pak.

## 2016-07-07 ENCOUNTER — Ambulatory Visit: Payer: Commercial Managed Care - HMO

## 2016-07-25 ENCOUNTER — Other Ambulatory Visit: Payer: Self-pay | Admitting: *Deleted

## 2016-07-25 MED ORDER — VITAMIN D (ERGOCALCIFEROL) 1.25 MG (50000 UNIT) PO CAPS: 50000.0000 [IU] | ORAL_CAPSULE | ORAL | 0 refills | Status: DC

## 2016-07-29 ENCOUNTER — Ambulatory Visit (INDEPENDENT_AMBULATORY_CARE_PROVIDER_SITE_OTHER): Payer: Commercial Managed Care - HMO | Admitting: Physician Assistant

## 2016-07-29 VITALS — BP 146/76 | HR 82

## 2016-07-29 DIAGNOSIS — E538 Deficiency of other specified B group vitamins: Secondary | ICD-10-CM | POA: Diagnosis not present

## 2016-07-29 IMAGING — CT CT PARANASAL SINUSES LIMITED
1 series · 13 of 15 positions shown, 17 images · non-contrast
Comparison: None.

CLINICAL DATA: Pain across the high since [DATE] with sinus
pressure, congestion, cough. Assessment for chronic sinusitis.

EXAM:
CT PARANASAL SINUS LIMITED WITHOUT CONTRAST
TECHNIQUE: Non-contiguous multidetector CT images of the paranasal sinuses were
obtained in a single plane without contrast.

[Series 3: limited sinus st · axial · 0.30mm/px · z∈[-140,-20]mm · 13 of 15 slices shown, 17 images]
[im 2/15  brain]
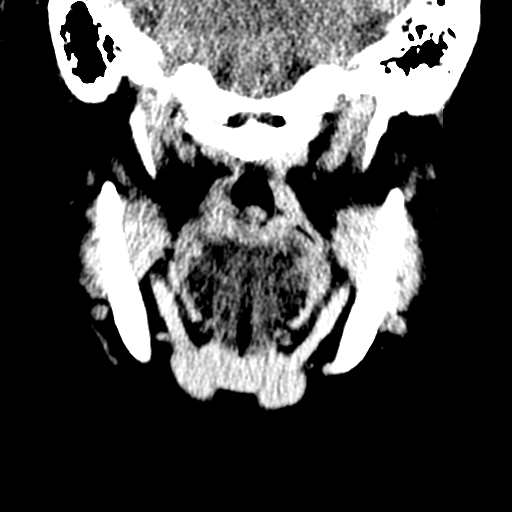
[im 2/15  bone]
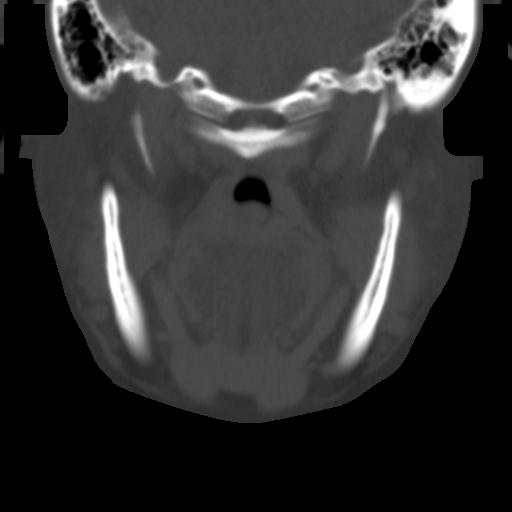
[im 3/15  bone]
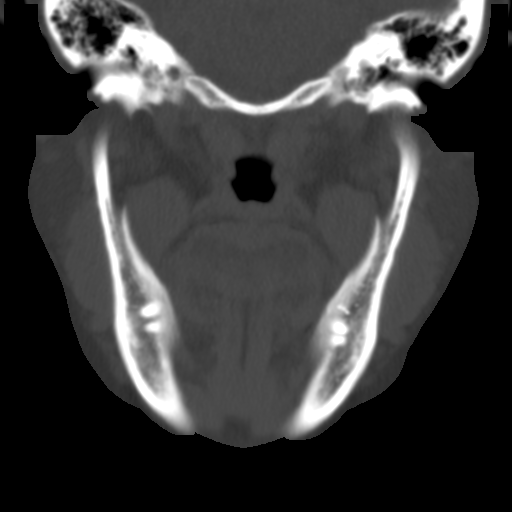
[im 4/15  bone]
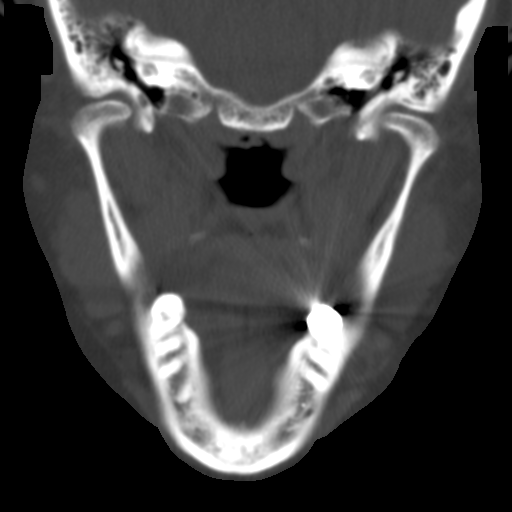
[im 5/15  bone]
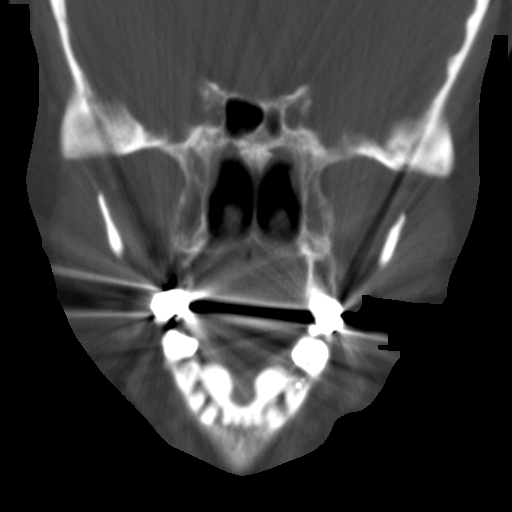
[im 6/15  brain]
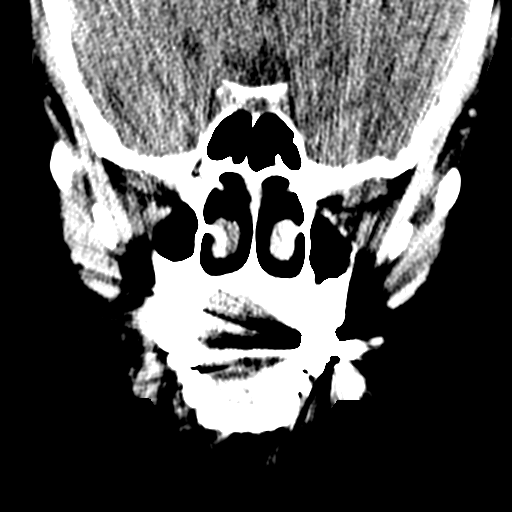
[im 6/15  bone]
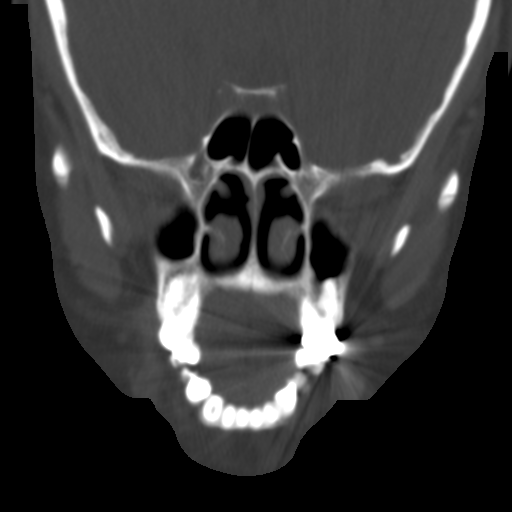
[im 7/15  bone]
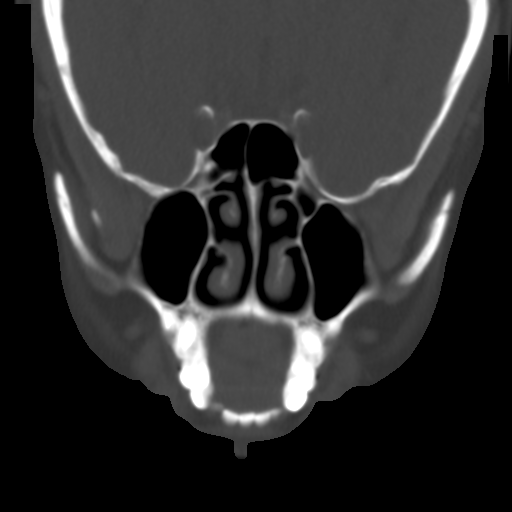
[im 8/15  bone]
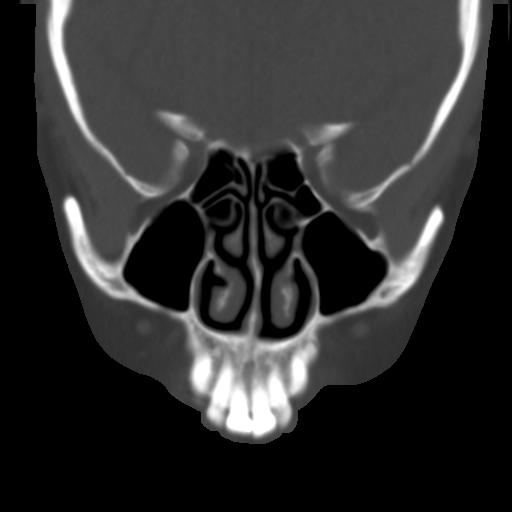
[im 9/15  bone]
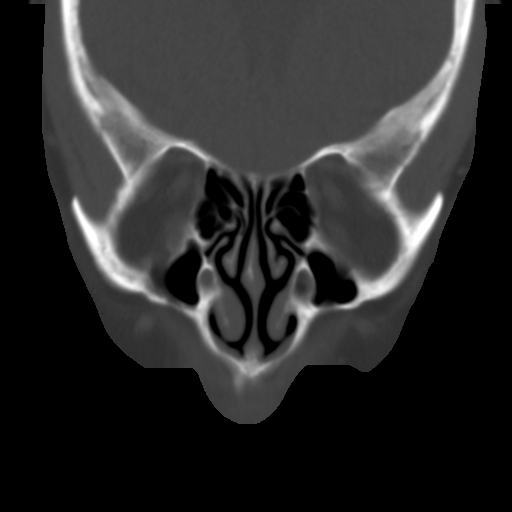
[im 10/15  brain]
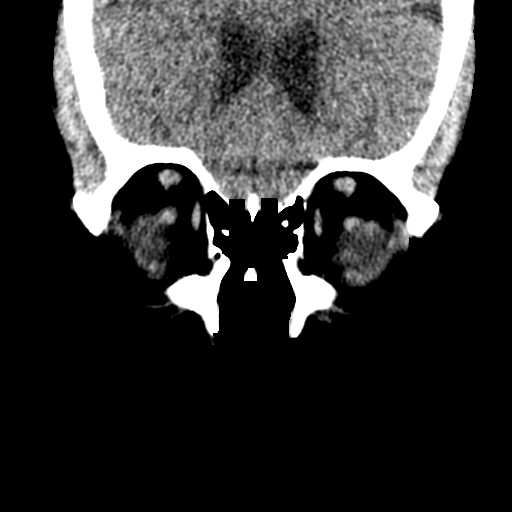
[im 10/15  bone]
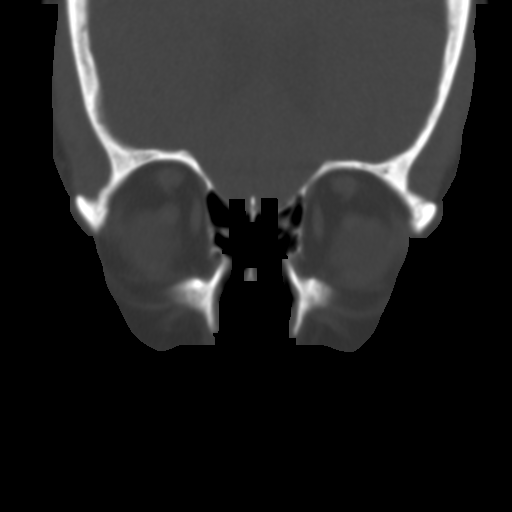
[im 11/15  bone]
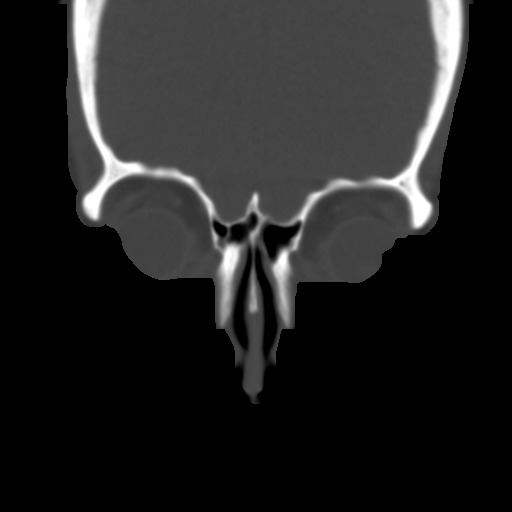
[im 12/15  bone]
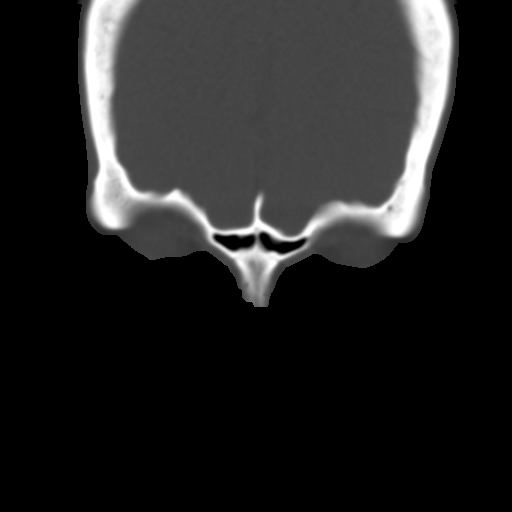
[im 13/15  bone]
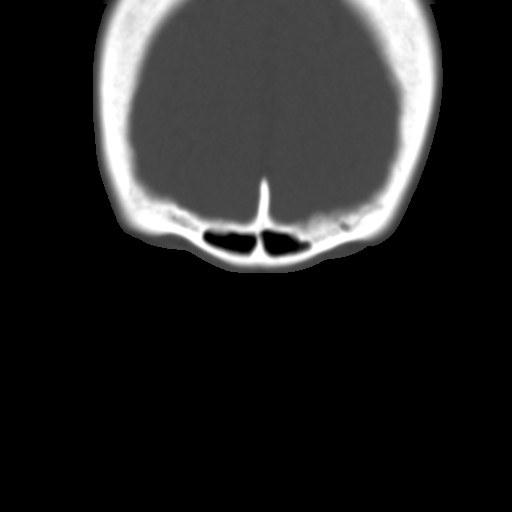
[im 14/15  brain]
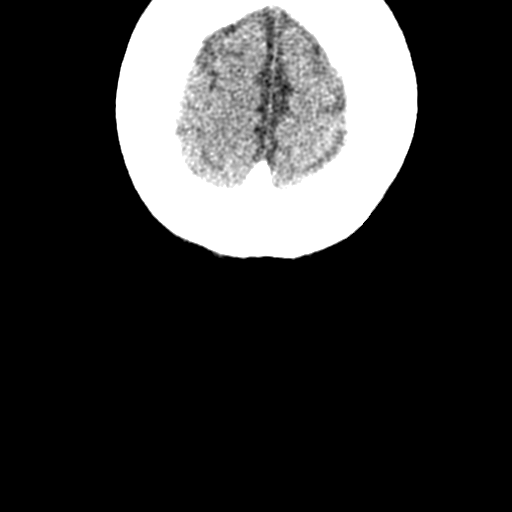
[im 14/15  bone]
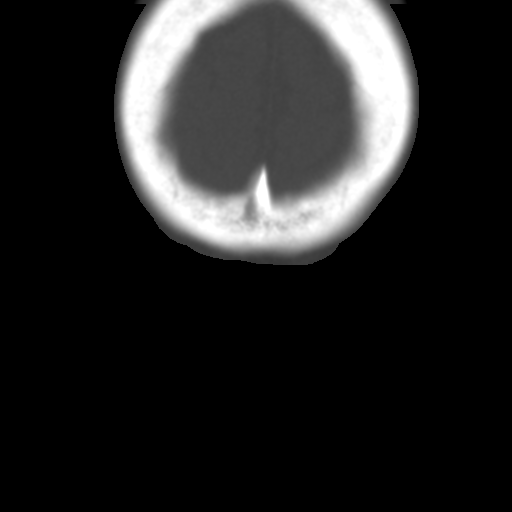

[13 of 15 positions shown; findings below may reference images not displayed]

FINDINGS: The visualized portions of the paranasal sinuses are clear without
evidence of significant mucosal thickening or fluid levels. There is
minimal S-shaped curvature of the nasal septum. The visualized
regional soft tissues are grossly unremarkable.
IMPRESSION: Clear sinuses.

## 2016-07-29 MED ORDER — CYANOCOBALAMIN 1000 MCG/ML IJ SOLN
1000.0000 ug | Freq: Once | INTRAMUSCULAR | Status: AC
  Administered 2016-07-29: 1000 ug via INTRAMUSCULAR

## 2016-07-29 NOTE — Progress Notes (Signed)
Pt is here for her monthly B 12 injection. Pt tolerated injection well in the left deltoid without any complications.   Call pt: need to get b12 checked and see what levels are at with supplementation. Iran Planas PA-C

## 2016-08-26 ENCOUNTER — Ambulatory Visit (INDEPENDENT_AMBULATORY_CARE_PROVIDER_SITE_OTHER): Payer: Commercial Managed Care - HMO | Admitting: Physician Assistant

## 2016-08-26 VITALS — BP 139/79 | HR 77

## 2016-08-26 DIAGNOSIS — Z1239 Encounter for other screening for malignant neoplasm of breast: Secondary | ICD-10-CM

## 2016-08-26 DIAGNOSIS — E538 Deficiency of other specified B group vitamins: Secondary | ICD-10-CM

## 2016-08-26 DIAGNOSIS — Z1231 Encounter for screening mammogram for malignant neoplasm of breast: Secondary | ICD-10-CM

## 2016-08-26 MED ORDER — CYANOCOBALAMIN 1000 MCG/ML IJ SOLN
1000.0000 ug | Freq: Once | INTRAMUSCULAR | Status: AC
  Administered 2016-08-26: 1000 ug via INTRAMUSCULAR

## 2016-08-26 NOTE — Progress Notes (Signed)
Patient came into clinic today for Vitamin B12 injection. Pt reports no palpitations or any other negative side effects from this medication. Did look over Pt's health maintenance, she is due for a mammogram. Pt would like to go to the breast center in [facility] since that is where she has had all prior mammograms. Will place order. Pt also reports she has had a Pap at the [facility] center in [facility]. Called and path report is being sent over. HM updated with date.

## 2016-09-12 ENCOUNTER — Ambulatory Visit (INDEPENDENT_AMBULATORY_CARE_PROVIDER_SITE_OTHER): Payer: Commercial Managed Care - HMO | Admitting: Physician Assistant

## 2016-09-12 ENCOUNTER — Encounter: Payer: Self-pay | Admitting: Physician Assistant

## 2016-09-12 VITALS — BP 134/79 | HR 86 | Ht 64.0 in | Wt 175.0 lb

## 2016-09-12 DIAGNOSIS — J0100 Acute maxillary sinusitis, unspecified: Secondary | ICD-10-CM

## 2016-09-12 MED ORDER — PREDNISONE 50 MG PO TABS: ORAL_TABLET | ORAL | 0 refills | Status: DC

## 2016-09-12 MED ORDER — AMOXICILLIN-POT CLAVULANATE 875-125 MG PO TABS: 1.0000 | ORAL_TABLET | Freq: Two times a day (BID) | ORAL | 0 refills | Status: DC

## 2016-09-12 NOTE — Patient Instructions (Signed)

## 2016-09-13 ENCOUNTER — Encounter: Payer: Self-pay | Admitting: Physician Assistant

## 2016-09-13 NOTE — Progress Notes (Signed)
   Subjective:    Patient ID: Amanda Novak, female    DOB: December 22, 1955, 60 y.o.   MRN: [id number]  HPI Pt is a 60 yo female with hx of allergies and sinusitis that presents to the clinic with one month of sinus pressure, nasal congestion, dull frontal headache, ear pressure, and dry cough. She has tried tylenol cold, sinus, severe with little relief. She cannot tolerate zyrtec and concerned about using anti-histamines to control allergies. No fever, chills, SOB, wheezing.    Review of Systems  All other systems reviewed and are negative.      Objective:   Physical Exam  Constitutional: She appears well-developed and well-nourished.  HENT:  Head: Normocephalic and atraumatic.  TM"s erythematous and dull. Dull light reflex.  Tenderness over maxillary sinuses to palpation.  Oropharynx erythematous with PND.  Bilateral nasal turbinates red and swollen.   Eyes: Conjunctivae are normal.  Neck: Normal range of motion. Neck supple.  Cardiovascular: Normal rate, regular rhythm and normal heart sounds.   Pulmonary/Chest: Effort normal and breath sounds normal.  Lymphadenopathy:    She has cervical adenopathy.  Skin: Skin is warm.          Assessment & Plan:  Marland KitchenMarland KitchenCarigan was seen today for headache.  Diagnoses and all orders for this visit:  Acute maxillary sinusitis, recurrence not specified -     amoxicillin-clavulanate (AUGMENTIN) 875-125 MG tablet; Take 1 tablet by mouth 2 (two) times daily. For 10 days. -     predniSONE (DELTASONE) 50 MG tablet; Take one tablet for 5 days.   Discussed symptomatic care. Due to symptoms ongoing for one month will treat for sinusitis. Consider trying claritin for allergy relief and prevention of sinus infections.

## 2016-09-24 ENCOUNTER — Ambulatory Visit: Payer: Commercial Managed Care - HMO

## 2016-10-13 ENCOUNTER — Other Ambulatory Visit: Payer: Self-pay

## 2016-10-13 DIAGNOSIS — E538 Deficiency of other specified B group vitamins: Secondary | ICD-10-CM

## 2016-10-13 DIAGNOSIS — E559 Vitamin D deficiency, unspecified: Secondary | ICD-10-CM

## 2016-10-13 DIAGNOSIS — R7303 Prediabetes: Secondary | ICD-10-CM

## 2016-10-13 DIAGNOSIS — E7849 Other hyperlipidemia: Secondary | ICD-10-CM

## 2016-10-15 ENCOUNTER — Other Ambulatory Visit: Payer: Self-pay | Admitting: Physician Assistant

## 2016-10-15 ENCOUNTER — Telehealth: Payer: Self-pay | Admitting: Physician Assistant

## 2016-10-15 DIAGNOSIS — Z1239 Encounter for other screening for malignant neoplasm of breast: Secondary | ICD-10-CM

## 2016-10-15 NOTE — Telephone Encounter (Signed)
Please call patient. LDL is elevated. Is patient taking lipitor?  TG increased a bit. Fish oil 4054m can help with this.  Alk phos(liver enzyme) up a bit was down last year. Recheck in 4 weeks. Are you drinking alcohol or taking more tylenol?  Continues to be pre-diabetic. Work on diet and exericse.  Vitamin d low start OTC D3  20013mdaily after completin 50,000 unit prescription.  b12 great.

## 2016-10-20 NOTE — Telephone Encounter (Signed)
Ok lets just check cholesterol and vitamin D in 4 months to see how lipitor and vitamin d are working. Do you need any refills do get you to recheck?

## 2016-10-20 NOTE — Telephone Encounter (Signed)
Patient had not taken lipitor in a little while. She is also on vitamin d 50,000 units but she has not taken that in a while either. She just got the prescriptions refilled the other day . Also she sees a GI a specialist who follows her liver enzymes and she also even had a liver biopsy. Just FYI

## 2016-10-23 NOTE — Telephone Encounter (Signed)
Message left on vm 

## 2016-11-07 ENCOUNTER — Encounter: Payer: Self-pay | Admitting: Physician Assistant

## 2016-11-07 ENCOUNTER — Ambulatory Visit (INDEPENDENT_AMBULATORY_CARE_PROVIDER_SITE_OTHER): Payer: Commercial Managed Care - HMO | Admitting: Physician Assistant

## 2016-11-07 VITALS — BP 115/72 | HR 85 | Temp 98.3°F | Ht 64.0 in | Wt 173.0 lb

## 2016-11-07 DIAGNOSIS — B9789 Other viral agents as the cause of diseases classified elsewhere: Secondary | ICD-10-CM | POA: Diagnosis not present

## 2016-11-07 DIAGNOSIS — E538 Deficiency of other specified B group vitamins: Secondary | ICD-10-CM | POA: Diagnosis not present

## 2016-11-07 DIAGNOSIS — J069 Acute upper respiratory infection, unspecified: Secondary | ICD-10-CM | POA: Diagnosis not present

## 2016-11-07 DIAGNOSIS — J329 Chronic sinusitis, unspecified: Secondary | ICD-10-CM

## 2016-11-07 MED ORDER — CYANOCOBALAMIN 1000 MCG/ML IJ SOLN
1000.0000 ug | Freq: Once | INTRAMUSCULAR | Status: AC
  Administered 2016-11-07: 1000 ug via INTRAMUSCULAR

## 2016-11-07 MED ORDER — IPRATROPIUM BROMIDE 0.06 % NA SOLN: 2.0000 | Freq: Four times a day (QID) | NASAL | 2 refills | Status: DC

## 2016-11-07 MED ORDER — PREDNISONE 50 MG PO TABS: ORAL_TABLET | ORAL | 0 refills | Status: DC

## 2016-11-07 NOTE — Patient Instructions (Addendum)
Sinusitis, Adult Sinusitis is soreness and inflammation of your sinuses. Sinuses are hollow spaces in the bones around your face. They are located:  Around your eyes.  In the middle of your forehead.  Behind your nose.  In your cheekbones. Your sinuses and nasal passages are lined with a stringy fluid (mucus). Mucus normally drains out of your sinuses. When your nasal tissues get inflamed or swollen, the mucus can get trapped or blocked so air cannot flow through your sinuses. This lets bacteria, viruses, and funguses grow, and that leads to infection. Follow these instructions at home: Medicines  Take, use, or apply over-the-counter and prescription medicines only as told by your doctor. These may include nasal sprays.  If you were prescribed an antibiotic medicine, take it as told by your doctor. Do not stop taking the antibiotic even if you start to feel better. Hydrate and Humidify  Drink enough water to keep your pee (urine) clear or pale yellow.  Use a cool mist humidifier to keep the humidity level in your home above 50%.  Breathe in steam for 10-15 minutes, 3-4 times a day or as told by your doctor. You can do this in the bathroom while a hot shower is running.  Try not to spend time in cool or dry air. Rest  Rest as much as possible.  Sleep with your head raised (elevated).  Make sure to get enough sleep each night. General instructions  Put a warm, moist washcloth on your face 3-4 times a day or as told by your doctor. This will help with discomfort.  Wash your hands often with soap and water. If there is no soap and water, use hand sanitizer.  Do not smoke. Avoid being around people who are smoking (secondhand smoke).  Keep all follow-up visits as told by your doctor. This is important. Contact a doctor if:  You have a fever.  Your symptoms get worse.  Your symptoms do not get better within 10 days. Get help right away if:  You have a very bad  headache.  You cannot stop throwing up (vomiting).  You have pain or swelling around your face or eyes.  You have trouble seeing.  You feel confused.  Your neck is stiff.  You have trouble breathing. This information is not intended to replace advice given to you by your health care provider. Make sure you discuss any questions you have with your health care provider. Document Released: 02/25/2008 Document Revised: 05/04/2016 Document Reviewed: 07/04/2015 Elsevier Interactive Patient Education  2017 [employer] Upper Respiratory Infection, Adult Most upper respiratory infections (URIs) are caused by a virus. A URI affects the nose, throat, and upper air passages. The most common type of URI is often called "the common cold." Follow these instructions at home:  Take medicines only as told by your doctor.  Gargle warm saltwater or take cough drops to comfort your throat as told by your doctor.  Use a warm mist humidifier or inhale steam from a shower to increase air moisture. This may make it easier to breathe.  Drink enough fluid to keep your pee (urine) clear or pale yellow.  Eat soups and other clear broths.  Have a healthy diet.  Rest as needed.  Go back to work when your fever is gone or your doctor says it is okay.  You may need to stay home longer to avoid giving your URI to others.  You can also wear a face mask and wash your hands often  to prevent spread of the virus.  Use your inhaler more if you have asthma.  Do not use any tobacco products, including cigarettes, chewing tobacco, or electronic cigarettes. If you need help quitting, ask your doctor. Contact a doctor if:  You are getting worse, not better.  Your symptoms are not helped by medicine.  You have chills.  You are getting more short of breath.  You have brown or red mucus.  You have yellow or brown discharge from your nose.  You have pain in your face, especially when you bend  forward.  You have a fever.  You have puffy (swollen) neck glands.  You have pain while swallowing.  You have white areas in the back of your throat. Get help right away if:  You have very bad or constant:  Headache.  Ear pain.  Pain in your forehead, behind your eyes, and over your cheekbones (sinus pain).  Chest pain.  You have long-lasting (chronic) lung disease and any of the following:  Wheezing.  Long-lasting cough.  Coughing up blood.  A change in your usual mucus.  You have a stiff neck.  You have changes in your:  Vision.  Hearing.  Thinking.  Mood. This information is not intended to replace advice given to you by your health care provider. Make sure you discuss any questions you have with your health care provider. Document Released: 02/25/2008 Document Revised: 05/11/2016 Document Reviewed: 12/14/2013 Elsevier Interactive Patient Education  2017 [employer].

## 2016-11-07 NOTE — Progress Notes (Signed)
   Subjective:    Patient ID: Amanda Novak, female    DOB: June 02, 1956, 61 y.o.   MRN: [id number]  HPI  Pt is a 61 yo female who presents to the clinic with 5 days of low grade fever of 99.3, body aches, ear pressure, watery eyes. No SOB or wheezing. She is concerned about the flu. She would like to go to a funeral today but not if she has the flu.     Review of Systems  All other systems reviewed and are negative.      Objective:   Physical Exam  Constitutional: She is oriented to person, place, and time. She appears well-developed and well-nourished.  HENT:  Head: Normocephalic and atraumatic.  Right Ear: External ear normal.  Left Ear: External ear normal.  Nose: Nose normal.  Mouth/Throat: Oropharynx is clear and moist. No oropharyngeal exudate.  Tenderness over maxillary sinusitis.   Eyes: Conjunctivae are normal.  Clear watery discharge bilateral eyes. No redness or purulent discharge.   Neck: Normal range of motion. Neck supple.  Cardiovascular: Normal rate, regular rhythm and normal heart sounds.   Pulmonary/Chest: Effort normal and breath sounds normal. She has no wheezes.  Lymphadenopathy:    She has no cervical adenopathy.  Neurological: She is alert and oriented to person, place, and time.  Skin: Skin is warm and dry.          Assessment & Plan:  Marland KitchenMarland KitchenDiagnoses and all orders for this visit:  Upper respiratory infection, viral -     ipratropium (ATROVENT) 0.06 % nasal spray; Place 2 sprays into both nostrils 4 (four) times daily.  Vitamin B 12 deficiency -     cyanocobalamin ((VITAMIN B-12)) injection 1,000 mcg; Inject 1 mL (1,000 mcg total) into the muscle once.  Viral sinusitis -     ipratropium (ATROVENT) 0.06 % nasal spray; Place 2 sprays into both nostrils 4 (four) times daily. -     predniSONE (DELTASONE) 50 MG tablet; Take one tablet for 5 days.   Reassurance given.  Prednisone and atrovent given today.  Symptomatic care discussed with tylenol  cold and sinus. Encouraged sinus rinses.

## 2016-11-09 ENCOUNTER — Encounter: Payer: Self-pay | Admitting: Physician Assistant

## 2016-11-15 LAB — HM MAMMOGRAPHY

## 2016-11-28 ENCOUNTER — Encounter: Payer: Self-pay | Admitting: Physician Assistant

## 2016-12-02 ENCOUNTER — Encounter: Payer: Commercial Managed Care - HMO | Admitting: Family Medicine

## 2016-12-03 ENCOUNTER — Ambulatory Visit (INDEPENDENT_AMBULATORY_CARE_PROVIDER_SITE_OTHER): Payer: Commercial Managed Care - HMO | Admitting: Sports Medicine

## 2016-12-03 ENCOUNTER — Ambulatory Visit (INDEPENDENT_AMBULATORY_CARE_PROVIDER_SITE_OTHER): Payer: Commercial Managed Care - HMO

## 2016-12-03 ENCOUNTER — Encounter: Payer: Self-pay | Admitting: Sports Medicine

## 2016-12-03 DIAGNOSIS — M79644 Pain in right finger(s): Secondary | ICD-10-CM

## 2016-12-03 DIAGNOSIS — M65311 Trigger thumb, right thumb: Secondary | ICD-10-CM | POA: Insufficient documentation

## 2016-12-03 MED ORDER — IBUPROFEN 800 MG PO TABS: 800.0000 mg | ORAL_TABLET | Freq: Three times a day (TID) | ORAL | 2 refills | Status: DC | PRN

## 2016-12-03 NOTE — Assessment & Plan Note (Signed)
Pain at the base of the right first proximal phalanx/distal end of the first metacarpal. Thumb spica brace, ibuprofen 800, x-rays. Return in one month.

## 2016-12-03 NOTE — Progress Notes (Signed)
   Subjective:    I'm seeing this patient as a consultation for:  Amanda Planas, PA-C  CC: Right thumb pain  HPI: This is a pleasant 61 year old female, 2 half weeks ago she fell onto an outstretched right hand, since then she's had pain that she localizes on the volar first MCP, moderate, persistent without radiation, worse with gripping functions.  Past medical history:  Negative.  See flowsheet/record as well for more information.  Surgical history: Negative.  See flowsheet/record as well for more information.  Family history: Negative.  See flowsheet/record as well for more information.  Social history: Negative.  See flowsheet/record as well for more information.  Allergies, and medications have been entered into the medical record, reviewed, and no changes needed.   Review of Systems: No headache, visual changes, nausea, vomiting, diarrhea, constipation, dizziness, abdominal pain, skin rash, fevers, chills, night sweats, weight loss, swollen lymph nodes, body aches, joint swelling, muscle aches, chest pain, shortness of breath, mood changes, visual or auditory hallucinations.   Objective:   General: Well Developed, well nourished, and in no acute distress.  Neuro/Psych: Alert and oriented x3, extra-ocular muscles intact, able to move all 4 extremities, sensation grossly intact. Skin: Warm and dry, no rashes noted.  Respiratory: Not using accessory muscles, speaking in full sentences, trachea midline.  Cardiovascular: Pulses palpable, no extremity edema. Abdomen: Does not appear distended. Right hand: Tenderness the volar first MCP, collaterals are stable, good strength to flexion and extension. Negative Finkelstein sign. Visible swelling.  Impression and Recommendations:   This case required medical decision making of moderate complexity.  Pain of right thumb Pain at the base of the right first proximal phalanx/distal end of the first metacarpal. Thumb spica brace, ibuprofen  800, x-rays. Return in one month.

## 2016-12-18 ENCOUNTER — Encounter: Payer: Self-pay | Admitting: Sports Medicine

## 2016-12-18 ENCOUNTER — Ambulatory Visit (INDEPENDENT_AMBULATORY_CARE_PROVIDER_SITE_OTHER): Payer: Commercial Managed Care - HMO | Admitting: Sports Medicine

## 2016-12-18 DIAGNOSIS — M65311 Trigger thumb, right thumb: Secondary | ICD-10-CM | POA: Diagnosis not present

## 2016-12-18 MED ORDER — VITAMIN D (ERGOCALCIFEROL) 1.25 MG (50000 UNIT) PO CAPS
50000.0000 [IU] | ORAL_CAPSULE | ORAL | 0 refills | Status: DC
Start: 2016-12-18 — End: 2017-01-15

## 2016-12-18 NOTE — Assessment & Plan Note (Signed)
Failed several weeks of immobilization in a thumb spica. Flexor pollicis longus tendon sheath injection under ultrasound guidance as above. Return in one month.

## 2016-12-18 NOTE — Progress Notes (Signed)
  Subjective:    CC: Follow-up  HPI:  This is a pleasant 61 year old female, 2 weeks ago she fell onto her outstretched right hand, x-rays never showed any fractures and her pain was localized at the volar first MCP. Unfortunately she continues to have pain, really not much better from before, and she has developed some triggering at the IP joint. Pain is severe, persistent without radiation.   Past medical history:  Negative.  See flowsheet/record as well for more information.  Surgical history: Negative.  See flowsheet/record as well for more information.  Family history: Negative.  See flowsheet/record as well for more information.  Social history: Negative.  See flowsheet/record as well for more information.  Allergies, and medications have been entered into the medical record, reviewed, and no changes needed.   Review of Systems: No fevers, chills, night sweats, weight loss, chest pain, or shortness of breath.   Objective:    General: Well Developed, well nourished, and in no acute distress.  Neuro: Alert and oriented x3, extra-ocular muscles intact, sensation grossly intact.  HEENT: Normocephalic, atraumatic, pupils equal round reactive to light, neck supple, no masses, no lymphadenopathy, thyroid nonpalpable.  Skin: Warm and dry, no rashes. Cardiac: Regular rate and rhythm, no murmurs rubs or gallops, no lower extremity edema.  Respiratory: Clear to auscultation bilaterally. Not using accessory muscles, speaking in full sentences. Right hand: Tender to palpation at the volar MCP with visible and palpable triggering.  Procedure: Real-time Ultrasound Guided Injection of right first flexor tendon sheath Device: GE Logiq E  Verbal informed consent obtained.  Time-out conducted.  Noted no overlying erythema, induration, or other signs of local infection.  Skin prepped in a sterile fashion.  Local anesthesia: Topical Ethyl chloride.  With sterile technique and under real time  ultrasound guidance:  25-gauge needle advanced between the flexor pollicis longus and brevis, I injected 1/2 mL kenalog 40, 1/2 mL lidocaine. Completed without difficulty  Pain immediately resolved suggesting accurate placement of the medication.  Advised to call if fevers/chills, erythema, induration, drainage, or persistent bleeding.  Images permanently stored and available for review in the ultrasound unit.  Impression: Technically successful ultrasound guided injection.  Impression and Recommendations:    Trigger thumb, right thumb Failed several weeks of immobilization in a thumb spica. Flexor pollicis longus tendon sheath injection under ultrasound guidance as above. Return in one month.

## 2016-12-22 ENCOUNTER — Ambulatory Visit (INDEPENDENT_AMBULATORY_CARE_PROVIDER_SITE_OTHER): Payer: Commercial Managed Care - HMO | Admitting: Physician Assistant

## 2016-12-22 ENCOUNTER — Encounter: Payer: Self-pay | Admitting: Physician Assistant

## 2016-12-22 VITALS — BP 142/57 | HR 75 | Ht 64.0 in | Wt 173.0 lb

## 2016-12-22 DIAGNOSIS — E559 Vitamin D deficiency, unspecified: Secondary | ICD-10-CM | POA: Diagnosis not present

## 2016-12-22 DIAGNOSIS — R030 Elevated blood-pressure reading, without diagnosis of hypertension: Secondary | ICD-10-CM

## 2016-12-22 DIAGNOSIS — E538 Deficiency of other specified B group vitamins: Secondary | ICD-10-CM

## 2016-12-22 DIAGNOSIS — E785 Hyperlipidemia, unspecified: Secondary | ICD-10-CM

## 2016-12-22 MED ORDER — CYANOCOBALAMIN 1000 MCG/ML IJ SOLN
1000.0000 ug | Freq: Once | INTRAMUSCULAR | Status: AC
  Administered 2016-12-22: 1000 ug via INTRAMUSCULAR

## 2016-12-22 NOTE — Progress Notes (Deleted)
Pt is here for a vitamin B 12 injection. Denies gastrointestinal problems or dizziness.  Pt tolerated injection well without complications. Pt advised to schedule next injection in ( ) days.

## 2016-12-22 NOTE — Progress Notes (Signed)
   Subjective:    Patient ID: Amanda Novak, female    DOB: Sep 01, 1956, 61 y.o.   MRN: [id number]  HPI Pt is here for a vitamin B 12 injection. Denies gastrointestinal problems or dizziness.   Review of Systems     Objective:   Physical Exam        Assessment & Plan:  Pt tolerated injection well without complications. Pt advised to schedule next injection/BP check in 30 days.   BP elevated today. Discussed to decrease salt intake. Will recheck in 1 month. Iran Planas PA-C

## 2016-12-26 ENCOUNTER — Ambulatory Visit (INDEPENDENT_AMBULATORY_CARE_PROVIDER_SITE_OTHER): Payer: Commercial Managed Care - HMO | Admitting: Family Medicine

## 2016-12-26 ENCOUNTER — Encounter: Payer: Self-pay | Admitting: Family Medicine

## 2016-12-26 VITALS — BP 118/71 | HR 59 | Ht 64.0 in | Wt 172.0 lb

## 2016-12-26 DIAGNOSIS — H6012 Cellulitis of left external ear: Secondary | ICD-10-CM | POA: Diagnosis not present

## 2016-12-26 MED ORDER — SULFAMETHOXAZOLE-TRIMETHOPRIM 800-160 MG PO TABS: 1.0000 | ORAL_TABLET | Freq: Two times a day (BID) | ORAL | 0 refills | Status: DC

## 2016-12-26 NOTE — Patient Instructions (Signed)
Can apply hydrocortisone to the outer ear.  Can use peroxide mixed with water for the inner ear once or twice a day.

## 2016-12-26 NOTE — Progress Notes (Signed)
Subjective:    Patient ID: Amanda Novak, female    DOB: 09-08-1956, 61 y.o.   MRN: [id number]  HPI 61 year old female comes in today because she started noticing some discomfort and itching in her left ear about 3 days ago. She also noticed that it actually looked a little swollen in the meter compared to her right ear. She doesn't member any trauma or injury. She said that she thinks she may have actually scratched at it and her sleep and that may have made it worse. Check she feels like it's a little less swollen today. She has noticed some moisture draining from that left ear. She says it almost feels like an ear infection. No hearing loss. No other upper respiratory symptoms.    BP 118/71   Pulse (!) 59   Ht 5' 4"  (1.626 m)   Wt 172 lb (78 kg)   SpO2 100%   BMI 29.52 kg/m     Allergies  Allergen Reactions  . Tramadol Itching  . Codeine Hives and Itching  . Hydrocodone Hives and Itching  . Zyrtec [Cetirizine] Other (See Comments)    Headache    Past Medical History:  Diagnosis Date  . Fibromyalgia   . GERD (gastroesophageal reflux disease)   . Hyperlipidemia 10/09/2015  . Lactose intolerance   . Nipple discharge     Past Surgical History:  Procedure Laterality Date  . BREAST SURGERY    . tubal lig      Social History   Social History  . Marital status: Married    Spouse name: N/A  . Number of children: N/A  . Years of education: N/A   Occupational History  . Not on file.   Social History Main Topics  . Smoking status: Never Smoker  . Smokeless tobacco: Never Used  . Alcohol use Not on file  . Drug use: Unknown  . Sexual activity: Not on file   Other Topics Concern  . Not on file   Social History Narrative  . No narrative on file    No family history on file.  Outpatient Encounter Prescriptions as of 12/26/2016  Medication Sig  . atorvastatin (LIPITOR) 20 MG tablet Take 1 tablet (20 mg total) by mouth daily.  . cyanocobalamin (,VITAMIN  B-12,) 1000 MCG/ML injection 109mg injection IM every month in the clinic.  .Marland Kitchenibuprofen (ADVIL,MOTRIN) 800 MG tablet Take 1 tablet (800 mg total) by mouth every 8 (eight) hours as needed.  . pantoprazole (PROTONIX) 40 MG tablet Take 40 tablets by mouth daily.  . Vitamin D, Ergocalciferol, (DRISDOL) 50000 units CAPS capsule Take 1 capsule (50,000 Units total) by mouth every 7 (seven) days. No more refills until Vit D checked.  . sulfamethoxazole-trimethoprim (BACTRIM DS,SEPTRA DS) 800-160 MG tablet Take 1 tablet by mouth 2 (two) times daily.   No facility-administered encounter medications on file as of 12/26/2016.         Review of Systems        Objective:   Physical Exam  Constitutional: She is oriented to person, place, and time. She appears well-developed and well-nourished.  HENT:  Head: Normocephalic and atraumatic.  Right Ear: External ear normal.  Left Ear: External ear normal.  Nose: Nose normal.  Mouth/Throat: Oropharynx is clear and moist.  TMs and canals are clear.   Eyes: Conjunctivae and EOM are normal. Pupils are equal, round, and reactive to light.  Neck: Neck supple. No thyromegaly present.  Cardiovascular: Normal rate, regular rhythm and normal heart  sounds.   Pulmonary/Chest: Effort normal and breath sounds normal. She has no wheezes.  Lymphadenopathy:    She has no cervical adenopathy.  Neurological: She is alert and oriented to person, place, and time.  Skin: Skin is warm and dry.  Left outer ear is just a little bit pink compared to the right ear and the whole ear just looks a little bit swollen. No sign of distinct rash. No open wounds or excoriations no break in the skin. The ear canal itself looks clear and the tympanic membrane looks good with a good light reflex.  Psychiatric: She has a normal mood and affect.       Assessment & Plan:    Left ear cellulitis-we'll go ahead and put her on Bactrim to cover for methicillin-resistant staph aureus  since she has a penicillin allergy. And use topical hydrocortisone for itching and can use peroxide mixed with water for the inner ears and she is getting a little bit of clear drainage that I do not see any distinct otitis externa.

## 2017-01-14 LAB — LIPID PANEL
CHOLESTEROL: 192 mg/dL (ref 0–200)
HDL: 53 mg/dL (ref 35–70)
LDL Cholesterol: 117 mg/dL
LDL/HDL RATIO: 3.6

## 2017-01-15 ENCOUNTER — Other Ambulatory Visit: Payer: Self-pay | Admitting: *Deleted

## 2017-01-15 LAB — LIPID PANEL
CHOL/HDL RATIO: 3.6 ratio (ref 0.0–4.4)
Cholesterol, Total: 192 mg/dL (ref 100–199)
HDL: 53 mg/dL (ref >39)
LDL CALC: 117 mg/dL — AB (ref 0–99)
Triglycerides: 112 mg/dL (ref 0–149)
VLDL Cholesterol Cal: 22 mg/dL (ref 5–40)

## 2017-01-15 LAB — VITAMIN D 25 HYDROXY (VIT D DEFICIENCY, FRACTURES): Vit D, 25-Hydroxy: 46.5 ng/mL (ref 30.0–100.0)

## 2017-01-15 MED ORDER — VITAMIN D (ERGOCALCIFEROL) 1.25 MG (50000 UNIT) PO CAPS: 50000.0000 [IU] | ORAL_CAPSULE | ORAL | 0 refills | Status: DC

## 2017-01-15 MED ORDER — ATORVASTATIN CALCIUM 20 MG PO TABS: 20.0000 mg | ORAL_TABLET | Freq: Every day | ORAL | 3 refills | Status: DC

## 2017-01-15 NOTE — Progress Notes (Signed)
Call pt: vitamin D is great. LDL and TG much better. Do you need any refills?

## 2017-01-19 ENCOUNTER — Encounter: Payer: Self-pay | Admitting: Sports Medicine

## 2017-01-19 ENCOUNTER — Ambulatory Visit (INDEPENDENT_AMBULATORY_CARE_PROVIDER_SITE_OTHER): Payer: Commercial Managed Care - HMO | Admitting: Sports Medicine

## 2017-01-19 DIAGNOSIS — M65311 Trigger thumb, right thumb: Secondary | ICD-10-CM | POA: Diagnosis not present

## 2017-01-19 NOTE — Assessment & Plan Note (Signed)
Completely resolved after injection at the last visit 

## 2017-01-19 NOTE — Progress Notes (Signed)
  Subjective:    CC: Follow-up  HPI: Right trigger thumb: Flexor pollicis longus tendon sheath injection at the last visit has provided complete resolution of symptoms.  Past medical history:  Negative.  See flowsheet/record as well for more information.  Surgical history: Negative.  See flowsheet/record as well for more information.  Family history: Negative.  See flowsheet/record as well for more information.  Social history: Negative.  See flowsheet/record as well for more information.  Allergies, and medications have been entered into the medical record, reviewed, and no changes needed.   Review of Systems: No fevers, chills, night sweats, weight loss, chest pain, or shortness of breath.   Objective:    General: Well Developed, well nourished, and in no acute distress.  Neuro: Alert and oriented x3, extra-ocular muscles intact, sensation grossly intact.  HEENT: Normocephalic, atraumatic, pupils equal round reactive to light, neck supple, no masses, no lymphadenopathy, thyroid nonpalpable.  Skin: Warm and dry, no rashes. Cardiac: Regular rate and rhythm, no murmurs rubs or gallops, no lower extremity edema.  Respiratory: Clear to auscultation bilaterally. Not using accessory muscles, speaking in full sentences. Right hand: Palpable nodule but no triggering, full motion, full strength, no pain.  Impression and Recommendations:    Trigger thumb, right thumb Completely resolved after injection at the last visit.

## 2017-01-21 ENCOUNTER — Ambulatory Visit (INDEPENDENT_AMBULATORY_CARE_PROVIDER_SITE_OTHER): Payer: Commercial Managed Care - HMO | Admitting: Physician Assistant

## 2017-01-21 VITALS — BP 132/76 | HR 61

## 2017-01-21 DIAGNOSIS — E538 Deficiency of other specified B group vitamins: Secondary | ICD-10-CM

## 2017-01-21 MED ORDER — CYANOCOBALAMIN 1000 MCG/ML IJ SOLN
1000.0000 ug | Freq: Once | INTRAMUSCULAR | Status: AC
  Administered 2017-01-21: 1000 ug via INTRAMUSCULAR

## 2017-01-21 NOTE — Progress Notes (Signed)
Patient came into clinic today for monthly B12 injection. Pt reports no side effects from this medication. Tolerated administration in right deltoid well, no immediate complications. Pt advised to schedule her next B12 for one month, verbalized understanding. No further questions.   Agree with the above plan. Iran Planas PA_C

## 2017-01-27 ENCOUNTER — Encounter: Payer: Self-pay | Admitting: Physician Assistant

## 2017-02-18 ENCOUNTER — Ambulatory Visit (INDEPENDENT_AMBULATORY_CARE_PROVIDER_SITE_OTHER): Payer: Commercial Managed Care - HMO | Admitting: Physician Assistant

## 2017-02-18 VITALS — BP 130/62 | HR 69

## 2017-02-18 DIAGNOSIS — E538 Deficiency of other specified B group vitamins: Secondary | ICD-10-CM | POA: Diagnosis not present

## 2017-02-18 MED ORDER — CYANOCOBALAMIN 1000 MCG/ML IJ SOLN
1000.0000 ug | Freq: Once | INTRAMUSCULAR | Status: AC
  Administered 2017-02-18: 1000 ug via INTRAMUSCULAR

## 2017-02-18 NOTE — Progress Notes (Signed)
Patient came into clinic today for monthly B12 injection. Pt reports no side effects from this medication. Tolerated administration in left deltoid well, no immediate complications. Pt advised to schedule her next B12 for one month, verbalized understanding. No further questions.   Agree with above plan. Iran Planas PA-C

## 2017-03-18 ENCOUNTER — Ambulatory Visit (INDEPENDENT_AMBULATORY_CARE_PROVIDER_SITE_OTHER): Payer: 59 | Admitting: Family Medicine

## 2017-03-18 VITALS — BP 120/68 | HR 64

## 2017-03-18 DIAGNOSIS — E538 Deficiency of other specified B group vitamins: Secondary | ICD-10-CM | POA: Diagnosis not present

## 2017-03-18 MED ORDER — CYANOCOBALAMIN 1000 MCG/ML IJ SOLN
1000.0000 ug | Freq: Once | INTRAMUSCULAR | Status: AC
  Administered 2017-03-18: 1000 ug via INTRAMUSCULAR

## 2017-03-18 NOTE — Progress Notes (Signed)
Patient came into clinic today for monthly B12 injection. Pt reports no side effects from this medication. Tolerated administration in right deltoid well, no immediate complications. Pt does question how long she should continue getting B12 injections. She had her levels check on 10/14/16 and it was 627. She reports taking OTC would be much cheaper based on her deductible. Will route to Provider for review. If Pt is to continue injections, I will contact her with recommendation to schedule appointment.

## 2017-04-24 ENCOUNTER — Telehealth: Payer: Self-pay | Admitting: *Deleted

## 2017-04-24 MED ORDER — B-12 500 MCG PO TABS: 500.0000 ug | ORAL_TABLET | Freq: Every day | ORAL | 0 refills | Status: DC

## 2017-04-24 NOTE — Telephone Encounter (Signed)
Pills since 2 [place], she should follow-up with [name] to recheck B12 levels, doesn't look like these have been checked in a while

## 2017-04-24 NOTE — Telephone Encounter (Signed)
Patient called and wanting to know if she can have a prescription for the pill form of b12 instead of doing injections

## 2017-04-24 NOTE — Telephone Encounter (Signed)
Message left on patients vm 

## 2017-05-05 ENCOUNTER — Encounter: Payer: Self-pay | Admitting: Physician Assistant

## 2017-05-05 ENCOUNTER — Ambulatory Visit (INDEPENDENT_AMBULATORY_CARE_PROVIDER_SITE_OTHER): Payer: 59 | Admitting: Physician Assistant

## 2017-05-05 VITALS — BP 144/89 | HR 73 | Ht 64.0 in | Wt 178.0 lb

## 2017-05-05 DIAGNOSIS — T2220XA Burn of second degree of shoulder and upper limb, except wrist and hand, unspecified site, initial encounter: Secondary | ICD-10-CM

## 2017-05-05 DIAGNOSIS — T22112A Burn of first degree of left forearm, initial encounter: Secondary | ICD-10-CM | POA: Diagnosis not present

## 2017-05-05 MED ORDER — TRIAMCINOLONE ACETONIDE 0.1 % EX OINT: 1.0000 "application " | TOPICAL_OINTMENT | Freq: Two times a day (BID) | CUTANEOUS | 0 refills | Status: DC

## 2017-05-05 MED ORDER — SILVER SULFADIAZINE 1 % EX CREA: 1.0000 "application " | TOPICAL_CREAM | Freq: Every day | CUTANEOUS | 0 refills | Status: DC
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
## 2017-05-05 NOTE — Patient Instructions (Addendum)
- Keep areas clean, dry and covered - Apply silvadene to upper left arm, apply nonadherent dressing and tegaderm daily - Apply kenalog cream to left forearm - Wear long sleeves at work - Monitor for signs of secondary infection - worsening redness, streaking, pain or purulent drainage    Burn Care, Adult A burn is an injury to the skin or the tissues under the skin. There are three types of burns:  First degree. These burns may cause the skin to be red and slightly swollen.  Second degree. These burns are very painful and cause the skin to be very red. The skin may also leak fluid, look shiny, and develop blisters.  Third degree. These burns cause permanent damage. They either turn the skin white or black and make it look charred, dry, and leathery.  Taking care of your burn properly can help to prevent pain and infection. It can also help the burn to heal more quickly. What are the risks? Complications from burns include:  Damage to the skin.  Reduced blood flow near the injury.  Dead tissue.  Scarring.  Problems with movement, if the burn happened near a joint or on the hands or feet.  Severe burns can lead to problems that affect the whole body, such as:  Fluid loss.  Less blood circulating in the body.  Inability to maintain a normal core body temperature (thermoregulation).  Infection.  Shock.  Problems breathing.  How to care for a first-degree burn Right after a burn:  Rinse or soak the burn under cool water until the pain stops. Do not put ice on your burn. This can cause more damage.  Lightly cover the burn with a sterile cloth (dressing). Burn care  Follow instructions from your health care provider about: ? How to clean and take care of the burn. ? When to change and remove the dressing.  Check your burn every day for signs of infection. Check for: ? More redness, swelling, or pain. ? Warmth. ? Pus or a bad smell. Medicine  Take  over-the-counter and prescription medicines only as told by your health care provider.  If you were prescribed antibiotic medicine, take or apply it as told by your health care provider. Do not stop using the antibiotic even if your condition improves. General instructions  To prevent infection, do not put butter, oil, or other home remedies on your burn.  Do not rub your burn, even when you are cleaning it.  Protect your burn from the sun. How to care for a second-degree burn Right after a burn:  Rinse or soak the burn under cool water. Do this for several minutes. Do not put ice on your burn. This can cause more damage.  Lightly cover the burn with a sterile cloth (dressing). Burn care  Raise (elevate) the injured area above the level of your heart while sitting or lying down.  Follow instructions from your health care provider about: ? How to clean and take care of the burn. ? When to change and remove the dressing.  Check your burn every day for signs of infection. Check for: ? More redness, swelling, or pain. ? Warmth. ? Pus or a bad smell. Medicine   Take over-the-counter and prescription medicines only as told by your health care provider.  If you were prescribed antibiotic medicine, take or apply it as told by your health care provider. Do not stop using the antibiotic even if your condition improves. General instructions  To prevent  infection: ? Do not put butter, oil, or other home remedies on the burn. ? Do not scratch or pick at the burn. ? Do not break any blisters. ? Do not peel skin.  Do not rub your burn, even when you are cleaning it.  Protect your burn from the sun. How to care for a third-degree burn Right after a burn:  Lightly cover the burn with gauze.  Seek immediate medical attention. Burn care  Raise (elevate) the injured area above the level of your heart while sitting or lying down.  Drink enough fluid to keep your urine clear or pale  yellow.  Rest as told by your health care provider. Do not participate in sports or other physical activities until your health care provider approves.  Follow instructions from your health care provider about: ? How to clean and take care of the burn. ? When to change and remove the dressing.  Check your burn every day for signs of infection. Check for: ? More redness, swelling, or pain. ? Warmth. ? Pus or a bad smell. Medicine  Take over-the-counter and prescription medicines only as told by your health care provider.  If you were prescribed antibiotic medicine, take or apply it as told by your health care provider. Do not stop using the antibiotic even if your condition improves. General instructions  To prevent infection: ? Do not put butter, oil, or other home remedies on the burn. ? Do not scratch or pick at the burn. ? Do not break any blisters. ? Do not peel skin. ? Do not rub your burn, even when you are cleaning it.  Protect your burn from the sun.  Keep all follow-up visits as told by your health care provider. This is important. Contact a health care provider if:  Your condition does not improve.  Your condition gets worse.  You have a fever.  Your burn changes in appearance or develops black or red spots.  Your burn feels warm to the touch.  Your pain is not controlled with medicine. Get help right away if:  You have redness, swelling, or pain at the site of the burn.  You have fluid, blood, or pus coming from your burn.  You have red streaks near the burn.  You have severe pain. This information is not intended to replace advice given to you by your health care provider. Make sure you discuss any questions you have with your health care provider. Document Released: 09/08/2005 Document Revised: 03/30/2016 Document Reviewed: 02/26/2016 Elsevier Interactive Patient Education  2018 [employer].

## 2017-06-01 ENCOUNTER — Other Ambulatory Visit: Payer: Self-pay | Admitting: Physician Assistant

## 2017-06-23 ENCOUNTER — Other Ambulatory Visit: Payer: Self-pay | Admitting: Physician Assistant

## 2017-06-23 DIAGNOSIS — R269 Unspecified abnormalities of gait and mobility: Secondary | ICD-10-CM | POA: Diagnosis not present

## 2017-06-23 DIAGNOSIS — M545 Low back pain: Secondary | ICD-10-CM | POA: Diagnosis not present

## 2017-06-24 ENCOUNTER — Telehealth: Payer: Self-pay | Admitting: Physician Assistant

## 2017-06-24 ENCOUNTER — Other Ambulatory Visit: Payer: Self-pay | Admitting: *Deleted

## 2017-06-24 MED ORDER — VITAMIN D (ERGOCALCIFEROL) 1.25 MG (50000 UNIT) PO CAPS: 50000.0000 [IU] | ORAL_CAPSULE | ORAL | 0 refills | Status: DC

## 2017-06-24 MED ORDER — B-12 500 MCG PO TABS: 500.0000 ug | ORAL_TABLET | Freq: Every day | ORAL | 0 refills | Status: AC

## 2017-06-24 NOTE — Telephone Encounter (Signed)
Pt called.  She is following up on Vitamin D refill request pharmacy faxed over also refill on Vitamin B-12.  Thank you.

## 2017-06-24 NOTE — Telephone Encounter (Signed)
Refills sent & pt notified.

## 2017-06-25 ENCOUNTER — Ambulatory Visit: Payer: 59

## 2017-06-29 ENCOUNTER — Ambulatory Visit: Payer: Self-pay

## 2017-08-26 DIAGNOSIS — R1013 Epigastric pain: Secondary | ICD-10-CM | POA: Diagnosis not present

## 2017-08-26 DIAGNOSIS — D649 Anemia, unspecified: Secondary | ICD-10-CM | POA: Diagnosis not present

## 2017-08-26 DIAGNOSIS — K5900 Constipation, unspecified: Secondary | ICD-10-CM | POA: Diagnosis not present

## 2017-08-26 DIAGNOSIS — K760 Fatty (change of) liver, not elsewhere classified: Secondary | ICD-10-CM | POA: Diagnosis not present

## 2017-08-26 DIAGNOSIS — K219 Gastro-esophageal reflux disease without esophagitis: Secondary | ICD-10-CM | POA: Diagnosis not present

## 2017-09-17 DIAGNOSIS — K219 Gastro-esophageal reflux disease without esophagitis: Secondary | ICD-10-CM | POA: Diagnosis not present

## 2017-09-17 DIAGNOSIS — R1013 Epigastric pain: Secondary | ICD-10-CM | POA: Diagnosis not present

## 2017-09-21 ENCOUNTER — Encounter: Payer: Self-pay | Admitting: Physician Assistant

## 2017-09-21 DIAGNOSIS — K829 Disease of gallbladder, unspecified: Secondary | ICD-10-CM | POA: Diagnosis not present

## 2017-09-21 DIAGNOSIS — K219 Gastro-esophageal reflux disease without esophagitis: Secondary | ICD-10-CM | POA: Diagnosis not present

## 2017-09-21 DIAGNOSIS — K760 Fatty (change of) liver, not elsewhere classified: Secondary | ICD-10-CM | POA: Diagnosis not present

## 2017-09-24 ENCOUNTER — Other Ambulatory Visit: Payer: Self-pay | Admitting: Physician Assistant

## 2017-10-02 DIAGNOSIS — R1013 Epigastric pain: Secondary | ICD-10-CM | POA: Diagnosis not present

## 2017-10-07 ENCOUNTER — Encounter: Payer: Self-pay | Admitting: Physician Assistant

## 2017-10-07 DIAGNOSIS — K760 Fatty (change of) liver, not elsewhere classified: Secondary | ICD-10-CM | POA: Insufficient documentation

## 2017-10-07 DIAGNOSIS — K8020 Calculus of gallbladder without cholecystitis without obstruction: Secondary | ICD-10-CM | POA: Diagnosis not present

## 2017-10-07 DIAGNOSIS — K824 Cholesterolosis of gallbladder: Secondary | ICD-10-CM | POA: Insufficient documentation

## 2017-10-07 DIAGNOSIS — R1011 Right upper quadrant pain: Secondary | ICD-10-CM | POA: Diagnosis not present

## 2017-10-07 DIAGNOSIS — R1013 Epigastric pain: Secondary | ICD-10-CM | POA: Diagnosis not present

## 2017-12-05 DIAGNOSIS — J09X2 Influenza due to identified novel influenza A virus with other respiratory manifestations: Secondary | ICD-10-CM | POA: Diagnosis not present

## 2017-12-05 DIAGNOSIS — J029 Acute pharyngitis, unspecified: Secondary | ICD-10-CM | POA: Diagnosis not present

## 2017-12-05 DIAGNOSIS — J0140 Acute pansinusitis, unspecified: Secondary | ICD-10-CM | POA: Diagnosis not present

## 2017-12-11 ENCOUNTER — Ambulatory Visit: Payer: BLUE CROSS/BLUE SHIELD | Admitting: Physician Assistant

## 2017-12-11 ENCOUNTER — Encounter: Payer: Self-pay | Admitting: Physician Assistant

## 2017-12-11 VITALS — BP 151/65 | HR 79 | Temp 98.3°F | Ht 64.0 in | Wt 167.0 lb

## 2017-12-11 DIAGNOSIS — R0981 Nasal congestion: Secondary | ICD-10-CM

## 2017-12-11 DIAGNOSIS — J101 Influenza due to other identified influenza virus with other respiratory manifestations: Secondary | ICD-10-CM

## 2017-12-11 DIAGNOSIS — B9789 Other viral agents as the cause of diseases classified elsewhere: Secondary | ICD-10-CM

## 2017-12-11 DIAGNOSIS — J019 Acute sinusitis, unspecified: Secondary | ICD-10-CM

## 2017-12-11 MED ORDER — FLUTICASONE PROPIONATE 50 MCG/ACT NA SUSP: 2.0000 | Freq: Every day | NASAL | 2 refills | Status: DC

## 2017-12-11 MED ORDER — METHYLPREDNISOLONE SODIUM SUCC 125 MG IJ SOLR
125.0000 mg | Freq: Once | INTRAMUSCULAR | Status: AC
  Administered 2017-12-11: 125 mg via INTRAMUSCULAR

## 2017-12-11 NOTE — Progress Notes (Signed)
   Subjective:    Patient ID: Amanda Novak, female    DOB: Sep 04, 1956, 62 y.o.   MRN: [id number]  HPI  Pt is a 62 yo female who presents to the clinic to follow up after minute clinic visit on 12/05/17 for influenza and sinusitis. Her body aches, fever, chills have improved but her sinus pressure more left than right has not improved. She was given amoxicillin and she is taking. She continues to take bottles of mucinex. It helps some. She just wants some pressure relief.   .. Active Ambulatory Problems    Diagnosis Date Noted  . Hyperlipidemia 10/09/2015  . Vitamin D deficiency 10/09/2015  . Memory loss 10/09/2015  . Vitamin B 12 deficiency 10/10/2015  . Prediabetes 10/10/2015  . Hemangioma of bone 11/15/2015  . GERD (gastroesophageal reflux disease) 11/15/2015  . NASH (nonalcoholic steatohepatitis) 11/15/2015  . Trigger thumb, right thumb 12/03/2016  . Fatty liver disease, nonalcoholic 16/94/5038  . Gallbladder polyp 10/07/2017   Resolved Ambulatory Problems    Diagnosis Date Noted  . First degree burn of left forearm 05/05/2017  . Burn of arm, left, second degree, initial encounter 05/05/2017   Past Medical History:  Diagnosis Date  . Fibromyalgia   . GERD (gastroesophageal reflux disease)   . Hyperlipidemia 10/09/2015  . Lactose intolerance   . Nipple discharge    .Marland KitchenNo family history on file.    Review of Systems  All other systems reviewed and are negative.      Objective:   Physical Exam  Constitutional: She is oriented to person, place, and time. She appears well-developed and well-nourished.  HENT:  Head: Normocephalic and atraumatic.  Right Ear: External ear normal.  Left Ear: External ear normal.  Bilateral TM"s retracted.  Oropharynx erythematous with no exudate or tonsil swelling.  Bilateral nasal turbinates red and swollen.  Tenderness over left maxillary sinus.   Eyes: Conjunctivae are normal. Right eye exhibits no discharge. Left eye exhibits no  discharge.  Neck: Normal range of motion. Neck supple.  Cardiovascular: Normal rate and regular rhythm.  SEM 3/6 heard best over 2nd left intercostal space   Pulmonary/Chest: Effort normal and breath sounds normal. She has no wheezes.  Lymphadenopathy:    She has no cervical adenopathy.  Neurological: She is alert and oriented to person, place, and time.  Skin: Skin is dry.  Psychiatric: She has a normal mood and affect. Her behavior is normal.          Assessment & Plan:  Marland KitchenMarland KitchenDiagnoses and all orders for this visit:  Acute viral sinusitis -     fluticasone (FLONASE) 50 MCG/ACT nasal spray; Place 2 sprays into both nostrils daily. -     methylPREDNISolone sodium succinate (SOLU-MEDROL) 125 mg/2 mL injection 125 mg  Influenza A  Nasal congestion -     fluticasone (FLONASE) 50 MCG/ACT nasal spray; Place 2 sprays into both nostrils daily.   I do think patient is improving.likely some residual congestion and inflammation.  Solumedrol 125mg  given IM today. Start flonase daily and as needed.  Reassurance given. Rest and hydrate. Follow up if not improving.

## 2017-12-11 NOTE — Patient Instructions (Signed)

## 2017-12-13 ENCOUNTER — Encounter: Payer: Self-pay | Admitting: Physician Assistant

## 2018-01-06 DIAGNOSIS — Z1231 Encounter for screening mammogram for malignant neoplasm of breast: Secondary | ICD-10-CM | POA: Diagnosis not present

## 2018-01-06 LAB — HM MAMMOGRAPHY

## 2018-01-07 ENCOUNTER — Telehealth: Payer: Self-pay | Admitting: *Deleted

## 2018-01-07 DIAGNOSIS — E538 Deficiency of other specified B group vitamins: Secondary | ICD-10-CM

## 2018-01-07 DIAGNOSIS — E559 Vitamin D deficiency, unspecified: Secondary | ICD-10-CM

## 2018-01-07 NOTE — Telephone Encounter (Signed)
Vitamin D and B12 labs ordered for [facility].

## 2018-01-11 DIAGNOSIS — E559 Vitamin D deficiency, unspecified: Secondary | ICD-10-CM | POA: Diagnosis not present

## 2018-01-11 DIAGNOSIS — E538 Deficiency of other specified B group vitamins: Secondary | ICD-10-CM | POA: Diagnosis not present

## 2018-01-12 LAB — VITAMIN B12: VITAMIN B 12: 659 pg/mL (ref 232–1245)

## 2018-01-12 LAB — VITAMIN D 25 HYDROXY (VIT D DEFICIENCY, FRACTURES): VIT D 25 HYDROXY: 35 ng/mL (ref 30.0–100.0)

## 2018-01-13 NOTE — Telephone Encounter (Signed)
Vitamins D and B12 in a normal range Can switch to OTC D3 2000 units daily Continue daily B12 supplement Monitor yearly

## 2018-01-18 ENCOUNTER — Ambulatory Visit: Payer: BLUE CROSS/BLUE SHIELD | Admitting: Sports Medicine

## 2018-01-19 DIAGNOSIS — R922 Inconclusive mammogram: Secondary | ICD-10-CM | POA: Diagnosis not present

## 2018-01-19 DIAGNOSIS — R928 Other abnormal and inconclusive findings on diagnostic imaging of breast: Secondary | ICD-10-CM | POA: Diagnosis not present

## 2018-01-19 DIAGNOSIS — N6324 Unspecified lump in the left breast, lower inner quadrant: Secondary | ICD-10-CM | POA: Diagnosis not present

## 2018-01-19 DIAGNOSIS — N6012 Diffuse cystic mastopathy of left breast: Secondary | ICD-10-CM | POA: Diagnosis not present

## 2018-01-19 LAB — HM MAMMOGRAPHY

## 2018-01-26 ENCOUNTER — Encounter: Payer: Self-pay | Admitting: Physician Assistant

## 2018-01-26 ENCOUNTER — Ambulatory Visit (INDEPENDENT_AMBULATORY_CARE_PROVIDER_SITE_OTHER): Payer: BLUE CROSS/BLUE SHIELD | Admitting: Physician Assistant

## 2018-01-26 VITALS — BP 104/60 | HR 78 | Ht 64.0 in | Wt 164.0 lb

## 2018-01-26 DIAGNOSIS — M79642 Pain in left hand: Secondary | ICD-10-CM

## 2018-01-26 DIAGNOSIS — Z8261 Family history of arthritis: Secondary | ICD-10-CM | POA: Diagnosis not present

## 2018-01-26 DIAGNOSIS — E785 Hyperlipidemia, unspecified: Secondary | ICD-10-CM

## 2018-01-26 DIAGNOSIS — E559 Vitamin D deficiency, unspecified: Secondary | ICD-10-CM

## 2018-01-26 DIAGNOSIS — Z111 Encounter for screening for respiratory tuberculosis: Secondary | ICD-10-CM

## 2018-01-26 DIAGNOSIS — Z0000 Encounter for general adult medical examination without abnormal findings: Secondary | ICD-10-CM

## 2018-01-26 DIAGNOSIS — Z1159 Encounter for screening for other viral diseases: Secondary | ICD-10-CM | POA: Diagnosis not present

## 2018-01-26 DIAGNOSIS — Z131 Encounter for screening for diabetes mellitus: Secondary | ICD-10-CM | POA: Diagnosis not present

## 2018-01-26 DIAGNOSIS — M79641 Pain in right hand: Secondary | ICD-10-CM | POA: Diagnosis not present

## 2018-01-26 MED ORDER — DICLOFENAC SODIUM 1 % TD GEL: 4.0000 g | Freq: Four times a day (QID) | TRANSDERMAL | 5 refills | Status: DC

## 2018-01-26 NOTE — Progress Notes (Signed)
B Subjective:     Amanda Novak is a 62 y.o. female and is here for a comprehensive physical exam. The patient reports problems - she does have a lot of bilateral hand pain in the joints. she has a family hx of RA and wonders if she could have. hands are stiffer in the am.    Pt needs work form filled out.   Social History   Socioeconomic History  . Marital status: Married    Spouse name: Not on file  . Number of children: Not on file  . Years of education: Not on file  . Highest education level: Not on file  Occupational History  . Not on file  Social Needs  . Financial resource strain: Not on file  . Food insecurity:    Worry: Not on file    Inability: Not on file  . Transportation needs:    Medical: Not on file    Non-medical: Not on file  Tobacco Use  . Smoking status: Never Smoker  . Smokeless tobacco: Never Used  Substance and Sexual Activity  . Alcohol use: Not on file  . Drug use: Not on file  . Sexual activity: Not on file  Lifestyle  . Physical activity:    Days per week: Not on file    Minutes per session: Not on file  . Stress: Not on file  Relationships  . Social connections:    Talks on phone: Not on file    Gets together: Not on file    Attends religious service: Not on file    Active member of club or organization: Not on file    Attends meetings of clubs or organizations: Not on file    Relationship status: Not on file  . Intimate partner violence:    Fear of current or ex partner: Not on file    Emotionally abused: Not on file    Physically abused: Not on file    Forced sexual activity: Not on file  Other Topics Concern  . Not on file  Social History Narrative  . Not on file   Health Maintenance  Topic Date Due  . Hepatitis C Screening  1956/07/30  . PAP SMEAR  06/07/2017  . HIV Screening  01/27/2019 (Originally 06/26/1972)  . INFLUENZA VACCINE  04/22/2018  . MAMMOGRAM  01/20/2020  . COLONOSCOPY  01/26/2022  . TETANUS/TDAP  01/04/2023     The following portions of the patient's history were reviewed and updated as appropriate: allergies, current medications, past family history, past medical history, past social history, past surgical history and problem list.  Review of Systems Pertinent items noted in HPI and remainder of comprehensive ROS otherwise negative.   Objective:    BP 104/60   Pulse 78   Ht 5\' 4"  (1.626 m)   Wt 164 lb (74.4 kg)   BMI 28.15 kg/m  General appearance: alert, cooperative and appears stated age Head: Normocephalic, without obvious abnormality, atraumatic Eyes: conjunctivae/corneas clear. PERRL, EOM's intact. Fundi benign. Ears: normal TM's and external ear canals both ears Nose: Nares normal. Septum midline. Mucosa normal. No drainage or sinus tenderness. Throat: lips, mucosa, and tongue normal; teeth and gums normal Neck: no adenopathy, no carotid bruit, no JVD, supple, symmetrical, trachea midline and thyroid not enlarged, symmetric, no tenderness/mass/nodules Back: symmetric, no curvature. ROM normal. No CVA tenderness. Lungs: clear to auscultation bilaterally Heart: regular rate and rhythm, S1, S2 normal, no murmur, click, rub or gallop Abdomen: soft, non-tender; bowel sounds normal;  no masses,  no organomegaly Extremities: extremities normal, atraumatic, no cyanosis or edema Pulses: 2+ and symmetric Skin: Skin color, texture, turgor normal. No rashes or lesions Lymph nodes: Cervical, supraclavicular, and axillary nodes normal. Neurologic: Alert and oriented X 3, normal strength and tone. Normal symmetric reflexes. Normal coordination and gait    Assessment:    Healthy female exam.      Plan:    Marland KitchenMarland KitchenMacklyn was seen today for annual exam.  Diagnoses and all orders for this visit:  Routine physical examination -     Lipid Panel w/reflex Direct LDL -     COMPLETE METABOLIC PANEL WITH GFR -     Hepatitis C Antibody -     Rheumatoid Factor -     Cyclic citrul peptide antibody,  IgG -     ANA -     TSH  Hyperlipidemia, unspecified hyperlipidemia type -     Lipid Panel w/reflex Direct LDL  Screening for diabetes mellitus -     COMPLETE METABOLIC PANEL WITH GFR  Need for hepatitis C screening test -     Hepatitis C Antibody  Bilateral hand pain -     Rheumatoid Factor -     Cyclic citrul peptide antibody, IgG -     diclofenac sodium (VOLTAREN) 1 % GEL; Apply 4 g topically 4 (four) times daily.  Screening-pulmonary TB -     PPD  Vitamin D deficiency  Family history of rheumatoid arthritis -     Rheumatoid Factor -     Cyclic citrul peptide antibody, IgG   .Marland Kitchen Depression screen PHQ 2/9 01/26/2018  Decreased Interest 0  Down, Depressed, Hopeless 0  PHQ - 2 Score 0   .. Discussed 150 minutes of exercise a week.  Encouraged vitamin D 1000 units and Calcium 1300mg  or 4 servings of dairy a day.  Screening labs ordered.  Colonoscopy up to date.  Mammogram up to date.  Discussed pap smear.    Due to family hx and morning stiffiness and joint pain in bilateral hands ordered RA panel. Given voltaren gel for hand pain.  See After Visit Summary for Counseling Recommendations

## 2018-01-28 ENCOUNTER — Ambulatory Visit (INDEPENDENT_AMBULATORY_CARE_PROVIDER_SITE_OTHER): Payer: BLUE CROSS/BLUE SHIELD | Admitting: Family Medicine

## 2018-01-28 VITALS — BP 111/58 | Temp 98.3°F | Resp 16

## 2018-01-28 DIAGNOSIS — Z23 Encounter for immunization: Secondary | ICD-10-CM

## 2018-01-28 LAB — TB SKIN TEST
INDURATION: 0 mm
TB Skin Test: NEGATIVE

## 2018-01-28 NOTE — Progress Notes (Signed)
Agree with documentation as above.   Amanda Stuck, MD  

## 2018-01-28 NOTE — Progress Notes (Signed)
   Subjective:    Patient ID: Amanda Novak, female    DOB: Jul 20, 1956, 62 y.o.   MRN: [id number]  HPI Patient here for PPD reading that was placed 01/26/18.    Review of Systems     Objective:   Physical Exam  Site is completely negative.      Assessment & Plan:  Form completed for workplace.

## 2018-01-29 ENCOUNTER — Encounter: Payer: Self-pay | Admitting: Physician Assistant

## 2018-01-29 ENCOUNTER — Ambulatory Visit: Payer: BLUE CROSS/BLUE SHIELD

## 2018-01-29 DIAGNOSIS — M79641 Pain in right hand: Secondary | ICD-10-CM | POA: Insufficient documentation

## 2018-01-29 DIAGNOSIS — M79642 Pain in left hand: Secondary | ICD-10-CM

## 2018-01-31 DIAGNOSIS — Z8261 Family history of arthritis: Secondary | ICD-10-CM | POA: Insufficient documentation

## 2018-02-03 ENCOUNTER — Other Ambulatory Visit: Payer: Self-pay | Admitting: Physician Assistant

## 2018-02-03 ENCOUNTER — Telehealth: Payer: Self-pay | Admitting: *Deleted

## 2018-02-03 DIAGNOSIS — Z131 Encounter for screening for diabetes mellitus: Secondary | ICD-10-CM | POA: Diagnosis not present

## 2018-02-03 DIAGNOSIS — E785 Hyperlipidemia, unspecified: Secondary | ICD-10-CM | POA: Diagnosis not present

## 2018-02-03 DIAGNOSIS — Z1159 Encounter for screening for other viral diseases: Secondary | ICD-10-CM | POA: Diagnosis not present

## 2018-02-03 DIAGNOSIS — Z0000 Encounter for general adult medical examination without abnormal findings: Secondary | ICD-10-CM | POA: Diagnosis not present

## 2018-02-03 NOTE — Telephone Encounter (Signed)
Pt left vm stating that the a1c she requested along with her other lab work was not ordered.  Per her ins, she has to go to [facility].  Would you like her to come in for a nurse visit point of care a1c or have it done at [place]?  Please advise.

## 2018-02-03 NOTE — Telephone Encounter (Signed)
Did she get other labs? Do they all need to be printed for [facility]? Ok to print a1c for [facility].

## 2018-02-05 LAB — COMPREHENSIVE METABOLIC PANEL
ALBUMIN: 4.3 g/dL (ref 3.6–4.8)
ALT: 26 IU/L (ref 0–32)
AST: 23 IU/L (ref 0–40)
Albumin/Globulin Ratio: 1.7 (ref 1.2–2.2)
Alkaline Phosphatase: 189 IU/L — ABNORMAL HIGH (ref 39–117)
BUN/Creatinine Ratio: 14 (ref 12–28)
BUN: 14 mg/dL (ref 8–27)
Bilirubin Total: 0.4 mg/dL (ref 0.0–1.2)
CALCIUM: 9.8 mg/dL (ref 8.7–10.3)
CO2: 24 mmol/L (ref 20–29)
CREATININE: 1 mg/dL (ref 0.57–1.00)
Chloride: 105 mmol/L (ref 96–106)
GFR calc Af Amer: 71 mL/min/{1.73_m2} (ref >59)
GFR, EST NON AFRICAN AMERICAN: 61 mL/min/{1.73_m2} (ref >59)
GLOBULIN, TOTAL: 2.5 g/dL (ref 1.5–4.5)
GLUCOSE: 103 mg/dL — AB (ref 65–99)
Potassium: 4.6 mmol/L (ref 3.5–5.2)
SODIUM: 143 mmol/L (ref 134–144)
Total Protein: 6.8 g/dL (ref 6.0–8.5)

## 2018-02-05 LAB — CYCLIC CITRUL PEPTIDE ANTIBODY, IGG/IGA: Cyclic Citrullin Peptide Ab: 5 units (ref 0–19)

## 2018-02-05 LAB — LIPID PANEL W/O CHOL/HDL RATIO
Cholesterol, Total: 180 mg/dL (ref 100–199)
HDL: 44 mg/dL (ref >39)
LDL CALC: 116 mg/dL — AB (ref 0–99)
Triglycerides: 102 mg/dL (ref 0–149)
VLDL CHOLESTEROL CAL: 20 mg/dL (ref 5–40)

## 2018-02-05 LAB — TSH: TSH: 2.07 u[IU]/mL (ref 0.450–4.500)

## 2018-02-05 LAB — HEPATITIS C ANTIBODY

## 2018-02-05 LAB — ANTINUCLEAR ANTIBODIES, IFA: ANA Titer 1: NEGATIVE

## 2018-02-05 LAB — RHEUMATOID FACTOR

## 2018-02-05 NOTE — Progress Notes (Signed)
Call pt: fasting glucose just a tad elevated. Ok to order a1c.  Cholesterol looks good.  Negative RA labs.  Thyroid looks great.  Alk phos(liver enzyme that has been elevated in the past but better 2 years ago) could be fatty liver. Will keep close watch. Recheck hepatic function panel in 3 months.  ANA pending.

## 2018-02-05 NOTE — Progress Notes (Signed)
Call pt: ANA negative.

## 2018-02-15 DIAGNOSIS — S01311A Laceration without foreign body of right ear, initial encounter: Secondary | ICD-10-CM | POA: Diagnosis not present

## 2018-02-15 DIAGNOSIS — Y92830 Public park as the place of occurrence of the external cause: Secondary | ICD-10-CM | POA: Diagnosis not present

## 2018-02-15 DIAGNOSIS — S8992XA Unspecified injury of left lower leg, initial encounter: Secondary | ICD-10-CM | POA: Diagnosis not present

## 2018-02-15 DIAGNOSIS — S43401A Unspecified sprain of right shoulder joint, initial encounter: Secondary | ICD-10-CM | POA: Diagnosis not present

## 2018-02-15 DIAGNOSIS — M25762 Osteophyte, left knee: Secondary | ICD-10-CM | POA: Diagnosis not present

## 2018-02-15 DIAGNOSIS — S0991XA Unspecified injury of ear, initial encounter: Secondary | ICD-10-CM | POA: Diagnosis not present

## 2018-02-15 DIAGNOSIS — S0990XA Unspecified injury of head, initial encounter: Secondary | ICD-10-CM | POA: Diagnosis not present

## 2018-02-15 DIAGNOSIS — R262 Difficulty in walking, not elsewhere classified: Secondary | ICD-10-CM | POA: Diagnosis not present

## 2018-02-15 DIAGNOSIS — Y9301 Activity, walking, marching and hiking: Secondary | ICD-10-CM | POA: Diagnosis not present

## 2018-02-15 DIAGNOSIS — R51 Headache: Secondary | ICD-10-CM | POA: Diagnosis not present

## 2018-02-15 DIAGNOSIS — S060X9A Concussion with loss of consciousness of unspecified duration, initial encounter: Secondary | ICD-10-CM | POA: Diagnosis not present

## 2018-02-15 DIAGNOSIS — W01198A Fall on same level from slipping, tripping and stumbling with subsequent striking against other object, initial encounter: Secondary | ICD-10-CM | POA: Diagnosis not present

## 2018-02-15 DIAGNOSIS — S8002XA Contusion of left knee, initial encounter: Secondary | ICD-10-CM | POA: Diagnosis not present

## 2018-02-16 ENCOUNTER — Encounter: Payer: Self-pay | Admitting: Physician Assistant

## 2018-02-16 ENCOUNTER — Ambulatory Visit (INDEPENDENT_AMBULATORY_CARE_PROVIDER_SITE_OTHER): Payer: BLUE CROSS/BLUE SHIELD

## 2018-02-16 ENCOUNTER — Ambulatory Visit: Payer: BLUE CROSS/BLUE SHIELD | Admitting: Physician Assistant

## 2018-02-16 VITALS — BP 143/58 | HR 67

## 2018-02-16 DIAGNOSIS — M79644 Pain in right finger(s): Secondary | ICD-10-CM | POA: Diagnosis not present

## 2018-02-16 DIAGNOSIS — M25541 Pain in joints of right hand: Secondary | ICD-10-CM

## 2018-02-16 DIAGNOSIS — M25562 Pain in left knee: Secondary | ICD-10-CM

## 2018-02-16 DIAGNOSIS — R7301 Impaired fasting glucose: Secondary | ICD-10-CM | POA: Diagnosis not present

## 2018-02-16 DIAGNOSIS — M542 Cervicalgia: Secondary | ICD-10-CM | POA: Diagnosis not present

## 2018-02-16 DIAGNOSIS — W19XXXA Unspecified fall, initial encounter: Secondary | ICD-10-CM | POA: Insufficient documentation

## 2018-02-16 DIAGNOSIS — W19XXXD Unspecified fall, subsequent encounter: Secondary | ICD-10-CM | POA: Diagnosis not present

## 2018-02-16 DIAGNOSIS — M25511 Pain in right shoulder: Secondary | ICD-10-CM | POA: Diagnosis not present

## 2018-02-16 DIAGNOSIS — S00431A Contusion of right ear, initial encounter: Secondary | ICD-10-CM | POA: Insufficient documentation

## 2018-02-16 DIAGNOSIS — S6991XA Unspecified injury of right wrist, hand and finger(s), initial encounter: Secondary | ICD-10-CM | POA: Diagnosis not present

## 2018-02-16 DIAGNOSIS — R7303 Prediabetes: Secondary | ICD-10-CM | POA: Diagnosis not present

## 2018-02-16 DIAGNOSIS — M7989 Other specified soft tissue disorders: Secondary | ICD-10-CM | POA: Diagnosis not present

## 2018-02-16 LAB — POCT GLYCOSYLATED HEMOGLOBIN (HGB A1C): HEMOGLOBIN A1C: 5.8 % — AB (ref 4.0–5.6)

## 2018-02-16 MED ORDER — MELOXICAM 7.5 MG PO TABS: 7.5000 mg | ORAL_TABLET | Freq: Every day | ORAL | 0 refills | Status: DC

## 2018-02-16 MED ORDER — CEPHALEXIN 500 MG PO CAPS: 500.0000 mg | ORAL_CAPSULE | Freq: Four times a day (QID) | ORAL | 0 refills | Status: DC

## 2018-02-16 NOTE — Progress Notes (Signed)
Subjective:    Patient ID: Amanda Novak, female    DOB: 03/06/1956, 62 y.o.   MRN: [id number]  HPI  Patient is a 62 year old female who presents to the clinic to follow-up after [place] and has multiple abrasions and contusions.  She was seen in the emergency room around that area but they do not have epic and they did not get any copies of what was done.  She is accompanied by her daughter today who reports that there was a CT of head that was negative, x-ray of right shoulder and of left knee that was negative for fractures.  Patient was given Toradol in the emergency room Patient was given Flexeril and discharged.  She is in quite a bit of pain today.  The most painful areas are her right ear, right shoulder, right thumb.  She also reports a dull consistent/persistent headache.  .. Active Ambulatory Problems    Diagnosis Date Noted  . Hyperlipidemia 10/09/2015  . Vitamin D deficiency 10/09/2015  . Memory loss 10/09/2015  . Vitamin B 12 deficiency 10/10/2015  . Prediabetes 10/10/2015  . Hemangioma of bone 11/15/2015  . GERD (gastroesophageal reflux disease) 11/15/2015  . NASH (nonalcoholic steatohepatitis) 11/15/2015  . Trigger thumb, right thumb 12/03/2016  . Fatty liver disease, nonalcoholic 38/75/6433  . Gallbladder polyp 10/07/2017  . Bilateral hand pain 01/29/2018  . Family history of rheumatoid arthritis 01/31/2018  . Fall 02/16/2018  . Ear hematoma, right 02/16/2018  . Pain of right thumb 02/17/2018  . Acute pain of right shoulder 02/17/2018  . Acute pain of left knee 02/17/2018   Resolved Ambulatory Problems    Diagnosis Date Noted  . First degree burn of left forearm 05/05/2017  . Burn of arm, left, second degree, initial encounter 05/05/2017   Past Medical History:  Diagnosis Date  . Fibromyalgia   . GERD (gastroesophageal reflux disease)   . Hyperlipidemia 10/09/2015  . Lactose intolerance   . Nipple discharge         Review of Systems  All other systems reviewed and are negative.      Objective:   Physical Exam  Constitutional: She is oriented to person, place, and time. She appears well-developed and well-nourished.  HENT:  Head: Normocephalic and atraumatic.  Cardiovascular: Normal rate and regular rhythm.  Pulmonary/Chest: Effort normal and breath sounds normal.  Musculoskeletal:  Right shoulder: painful to touch or rotate.  Right thumb-bruised and painful to move in any direction.  Abrasion to left knee.  Right ear swollen, red, tender. derma bonded laceration to the back of ear.   Neurological: She is alert and oriented to person, place, and time.  Psychiatric: She has a normal mood and affect. Her behavior is normal.          Assessment & Plan:  Marland KitchenMarland KitchenDiagnoses and all orders for this visit:  Fall, subsequent encounter -     DG Finger Thumb Right -     DG Cervical Spine Complete -     meloxicam (MOBIC) 7.5 MG tablet; Take 1 tablet (7.5 mg total) by mouth daily.  IFG (impaired fasting glucose) -     POCT HgB A1C  Prediabetes  Pain of right thumb -     meloxicam (MOBIC) 7.5 MG tablet; Take 1 tablet (7.5 mg total) by mouth daily.  Acute pain of right shoulder -     meloxicam (MOBIC) 7.5 MG tablet; Take 1 tablet (7.5 mg total) by  mouth daily.  Acute pain of left knee -     meloxicam (MOBIC) 7.5 MG tablet; Take 1 tablet (7.5 mg total) by mouth daily.  Hematoma of right ear, initial encounter -     meloxicam (MOBIC) 7.5 MG tablet; Take 1 tablet (7.5 mg total) by mouth daily. -     cephALEXin (KEFLEX) 500 MG capsule; Take 1 capsule (500 mg total) by mouth 4 (four) times daily.   Discussed timeline of pain for injuries like these. Since she did have impact to head likely a concussion occurred as well. Pt needs lots of rest, hydration and icing. Right thumb and neck were not xray in ED. Will order today.  Added mobic to take in the morning with PPI. Continue to take  tylenol 1000mg  3-4 times a day for next week or so.   Pt had hematoma of right ear and Dr. Darene Lamer was consulted and drained ear for me. See his note.   Right thumb is likely jammed. Keep in thumb spica and follow up with Dr. Darene Lamer in 1 week.   Written out of work for 2 weeks. 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   Pre-diabetes- discussed diet and exercise. Will recheck in 6 months.   Marland Kitchen.Spent 30 minutes with patient and greater than 50 percent of visit spent counseling patient regarding treatment plan.

## 2018-02-16 NOTE — Progress Notes (Signed)
Subjective:    CC: Fall  HPI: This weekend this pleasant 62 year old female fell, directly on a pole impacting her right ear.  She also bruised her shoulder, knee, she was seen in the emergency department, diagnosed with an auricular hematoma and referred here for further evaluation and definitive treatment.  Pain is severe over the right ear, localized without radiation.  I reviewed the past medical history, family history, social history, surgical history, and allergies today and no changes were needed.  Please see the problem list section below in epic for further details.  Past Medical History: Past Medical History:  Diagnosis Date  . Fibromyalgia   . GERD (gastroesophageal reflux disease)   . Hyperlipidemia 10/09/2015  . Lactose intolerance   . Nipple discharge    Past Surgical History: Past Surgical History:  Procedure Laterality Date  . BREAST SURGERY    . tubal lig     Social History: Social History   Socioeconomic History  . Marital status: Married    Spouse name: Not on file  . Number of children: Not on file  . Years of education: Not on file  . Highest education level: Not on file  Occupational History  . Not on file  Social Needs  . Financial resource strain: Not on file  . Food insecurity:    Worry: Not on file    Inability: Not on file  . Transportation needs:    Medical: Not on file    Non-medical: Not on file  Tobacco Use  . Smoking status: Never Smoker  . Smokeless tobacco: Never Used  Substance and Sexual Activity  . Alcohol use: Not on file  . Drug use: Not on file  . Sexual activity: Not on file  Lifestyle  . Physical activity:    Days per week: Not on file    Minutes per session: Not on file  . Stress: Not on file  Relationships  . Social connections:    Talks on phone: Not on file    Gets together: Not on file    Attends religious service: Not on file    Active member of club or organization: Not on file    Attends meetings of clubs  or organizations: Not on file    Relationship status: Not on file  Other Topics Concern  . Not on file  Social History Narrative  . Not on file   Family History: Family History  Problem Relation Age of Onset  . Alzheimer's disease Mother   . Rheum arthritis Mother    Allergies: Allergies  Allergen Reactions  . Tramadol Itching  . Codeine Hives and Itching  . Hydrocodone Hives and Itching  . Zyrtec [Cetirizine] Other (See Comments)    Headache   Medications: See med rec.  Review of Systems: No fevers, chills, night sweats, weight loss, chest pain, or shortness of breath.   Objective:    General: Well Developed, well nourished, and in no acute distress.  Neuro: Alert and oriented x3, extra-ocular muscles intact, sensation grossly intact.  HEENT: Normocephalic, swelling, palpable hematoma over the right ear, pupils equal round reactive to light, neck supple, no masses, no lymphadenopathy, thyroid nonpalpable.  Skin: Warm and dry, no rashes. Cardiac: Regular rate and rhythm, no murmurs rubs or gallops, no lower extremity edema.  Respiratory: Clear to auscultation bilaterally. Not using accessory muscles, speaking in full sentences.  Procedure: Right ear auricular hematoma evacuation/puncture drainage of lesion Consent obtained and verified. Time-out conducted. Noted no overlying erythema, induration,  or other signs of local infection. Skin prepped in a sterile fashion. I used approximately 1/2 mL of lidocaine without epinephrine for topical analgesia Then using an 18-gauge needle advanced into the auricular hematoma and drained approximately 1 to 2 mL of frank blood. Pressure dressing applied. Advised to call if fevers/chills, erythema, induration, drainage, or persistent bleeding.  Impression and Recommendations:    Ear hematoma, right Needle drainage of 1 to 2 mL of frank blood, compression dressing applied. Return to see me in 1  week.  ___________________________________________ Gwen Her. Dianah Field, M.D., ABFM., CAQSM. Primary Care and [place] Instructor of [place] [facility]

## 2018-02-16 NOTE — Patient Instructions (Addendum)
Tylenol 1000mg  up to 4000mg  a day for next 2 weeks.  mobic in the morning with protonix.  Ice everything except neck and heat that.    Shoulder Exercises Ask your health care provider which exercises are safe for you. Do exercises exactly as told by your health care provider and adjust them as directed. It is normal to feel mild stretching, pulling, tightness, or discomfort as you do these exercises, but you should stop right away if you feel sudden pain or your pain gets worse.Do not begin these exercises until told by your health care provider. RANGE OF MOTION EXERCISES These exercises warm up your muscles and joints and improve the movement and flexibility of your shoulder. These exercises also help to relieve pain, numbness, and tingling. These exercises involve stretching your injured shoulder directly. Exercise A: Pendulum  1. Stand near a wall or a surface that you can hold onto for balance. 2. Bend at the waist and let your left / right arm hang straight down. Use your other arm to support you. Keep your back straight and do not lock your knees. 3. Relax your left / right arm and shoulder muscles, and move your hips and your trunk so your left / right arm swings freely. Your arm should swing because of the motion of your body, not because you are using your arm or shoulder muscles. 4. Keep moving your body so your arm swings in the following directions, as told by your health care provider: ? Side to side. ? Forward and backward. ? In clockwise and counterclockwise circles. 5. Continue each motion for __________ seconds, or for as long as told by your health care provider. 6. Slowly return to the starting position. Repeat __________ times. Complete this exercise __________ times a day. Exercise B:Flexion, Standing  1. Stand and hold a broomstick, a cane, or a similar object. Place your hands a little more than shoulder-width apart on the object. Your left / right hand should be  palm-up, and your other hand should be palm-down. 2. Keep your elbow straight and keep your shoulder muscles relaxed. Push the stick down with your healthy arm to raise your left / right arm in front of your body, and then over your head until you feel a stretch in your shoulder. ? Avoid shrugging your shoulder while you raise your arm. Keep your shoulder blade tucked down toward the middle of your back. 3. Hold for __________ seconds. 4. Slowly return to the starting position. Repeat __________ times. Complete this exercise __________ times a day. Exercise C: Abduction, Standing 1. Stand and hold a broomstick, a cane, or a similar object. Place your hands a little more than shoulder-width apart on the object. Your left / right hand should be palm-up, and your other hand should be palm-down. 2. While keeping your elbow straight and your shoulder muscles relaxed, push the stick across your body toward your left / right side. Raise your left / right arm to the side of your body and then over your head until you feel a stretch in your shoulder. ? Do not raise your arm above shoulder height, unless your health care provider tells you to do that. ? Avoid shrugging your shoulder while you raise your arm. Keep your shoulder blade tucked down toward the middle of your back. 3. Hold for __________ seconds. 4. Slowly return to the starting position. Repeat __________ times. Complete this exercise __________ times a day. Exercise D:Internal Rotation  1. Place your left /  right hand behind your back, palm-up. 2. Use your other hand to dangle an exercise band, a towel, or a similar object over your shoulder. Grasp the band with your left / right hand so you are holding onto both ends. 3. Gently pull up on the band until you feel a stretch in the front of your left / right shoulder. ? Avoid shrugging your shoulder while you raise your arm. Keep your shoulder blade tucked down toward the middle of your  back. 4. Hold for __________ seconds. 5. Release the stretch by letting go of the band and lowering your hands. Repeat __________ times. Complete this exercise __________ times a day. STRETCHING EXERCISES These exercises warm up your muscles and joints and improve the movement and flexibility of your shoulder. These exercises also help to relieve pain, numbness, and tingling. These exercises are done using your healthy shoulder to help stretch the muscles of your injured shoulder. Exercise E: Warehouse manager (External Rotation and Abduction)  1. Stand in a doorway with one of your feet slightly in front of the other. This is called a staggered stance. If you cannot reach your forearms to the door frame, stand facing a corner of a room. 2. Choose one of the following positions as told by your health care provider: ? Place your hands and forearms on the door frame above your head. ? Place your hands and forearms on the door frame at the height of your head. ? Place your hands on the door frame at the height of your elbows. 3. Slowly move your weight onto your front foot until you feel a stretch across your chest and in the front of your shoulders. Keep your head and chest upright and keep your abdominal muscles tight. 4. Hold for __________ seconds. 5. To release the stretch, shift your weight to your back foot. Repeat __________ times. Complete this stretch __________ times a day. Exercise F:Extension, Standing 1. Stand and hold a broomstick, a cane, or a similar object behind your back. ? Your hands should be a little wider than shoulder-width apart. ? Your palms should face away from your back. 2. Keeping your elbows straight and keeping your shoulder muscles relaxed, move the stick away from your body until you feel a stretch in your shoulder. ? Avoid shrugging your shoulders while you move the stick. Keep your shoulder blade tucked down toward the middle of your back. 3. Hold for __________  seconds. 4. Slowly return to the starting position. Repeat __________ times. Complete this exercise __________ times a day. STRENGTHENING EXERCISES These exercises build strength and endurance in your shoulder. Endurance is the ability to use your muscles for a long time, even after they get tired. Exercise G:External Rotation  1. Sit in a stable chair without armrests. 2. Secure an exercise band at elbow height on your left / right side. 3. Place a soft object, such as a folded towel or a small pillow, between your left / right upper arm and your body to move your elbow a few inches away (about 10 cm) from your side. 4. Hold the end of the band so it is tight and there is no slack. 5. Keeping your elbow pressed against the soft object, move your left / right forearm out, away from your abdomen. Keep your body steady so only your forearm moves. 6. Hold for __________ seconds. 7. Slowly return to the starting position. Repeat __________ times. Complete this exercise __________ times a day. Exercise H:Shoulder Abduction  1. Sit in a stable chair without armrests, or stand. 2. Hold a __________ weight in your left / right hand, or hold an exercise band with both hands. 3. Start with your arms straight down and your left / right palm facing in, toward your body. 4. Slowly lift your left / right hand out to your side. Do not lift your hand above shoulder height unless your health care provider tells you that this is safe. ? Keep your arms straight. ? Avoid shrugging your shoulder while you do this movement. Keep your shoulder blade tucked down toward the middle of your back. 5. Hold for __________ seconds. 6. Slowly lower your arm, and return to the starting position. Repeat __________ times. Complete this exercise __________ times a day. Exercise I:Shoulder Extension 1. Sit in a stable chair without armrests, or stand. 2. Secure an exercise band to a stable object in front of you where it  is at shoulder height. 3. Hold one end of the exercise band in each hand. Your palms should face each other. 4. Straighten your elbows and lift your hands up to shoulder height. 5. Step back, away from the secured end of the exercise band, until the band is tight and there is no slack. 6. Squeeze your shoulder blades together as you pull your hands down to the sides of your thighs. Stop when your hands are straight down by your sides. Do not let your hands go behind your body. 7. Hold for __________ seconds. 8. Slowly return to the starting position. Repeat __________ times. Complete this exercise __________ times a day. Exercise J:Standing Shoulder Row 1. Sit in a stable chair without armrests, or stand. 2. Secure an exercise band to a stable object in front of you so it is at waist height. 3. Hold one end of the exercise band in each hand. Your palms should be in a thumbs-up position. 4. Bend each of your elbows to an "L" shape (about 90 degrees) and keep your upper arms at your sides. 5. Step back until the band is tight and there is no slack. 6. Slowly pull your elbows back behind you. 7. Hold for __________ seconds. 8. Slowly return to the starting position. Repeat __________ times. Complete this exercise __________ times a day. Exercise K:Shoulder Press-Ups  1. Sit in a stable chair that has armrests. Sit upright, with your feet flat on the floor. 2. Put your hands on the armrests so your elbows are bent and your fingers are pointing forward. Your hands should be about even with the sides of your body. 3. Push down on the armrests and use your arms to lift yourself off of the chair. Straighten your elbows and lift yourself up as much as you comfortably can. ? Move your shoulder blades down, and avoid letting your shoulders move up toward your ears. ? Keep your feet on the ground. As you get stronger, your feet should support less of your body weight as you lift yourself up. 4. Hold  for __________ seconds. 5. Slowly lower yourself back into the chair. Repeat __________ times. Complete this exercise __________ times a day. Exercise L: Wall Push-Ups  1. Stand so you are facing a stable wall. Your feet should be about one arm-length away from the wall. 2. Lean forward and place your palms on the wall at shoulder height. 3. Keep your feet flat on the floor as you bend your elbows and lean forward toward the wall. 4. Hold for __________ seconds. 5.  Straighten your elbows to push yourself back to the starting position. Repeat __________ times. Complete this exercise __________ times a day. This information is not intended to replace advice given to you by your health care provider. Make sure you discuss any questions you have with your health care provider. Document Released: 07/23/2005 Document Revised: 06/02/2016 Document Reviewed: 05/20/2015 Elsevier Interactive Patient Education  2018 [employer].

## 2018-02-16 NOTE — Assessment & Plan Note (Addendum)
Needle drainage of 1 to 2 mL of frank blood, compression dressing applied. Adding Keflex. Return to see me in 1 week.

## 2018-02-17 ENCOUNTER — Encounter: Payer: Self-pay | Admitting: Physician Assistant

## 2018-02-17 DIAGNOSIS — M25511 Pain in right shoulder: Secondary | ICD-10-CM | POA: Insufficient documentation

## 2018-02-17 DIAGNOSIS — M25562 Pain in left knee: Secondary | ICD-10-CM | POA: Insufficient documentation

## 2018-02-17 DIAGNOSIS — M79644 Pain in right finger(s): Secondary | ICD-10-CM | POA: Insufficient documentation

## 2018-02-17 DIAGNOSIS — M5030 Other cervical disc degeneration, unspecified cervical region: Secondary | ICD-10-CM | POA: Insufficient documentation

## 2018-02-17 DIAGNOSIS — M542 Cervicalgia: Secondary | ICD-10-CM | POA: Insufficient documentation

## 2018-02-17 NOTE — Progress Notes (Signed)
Call pt: no fracture of thumb seen.

## 2018-02-17 NOTE — Progress Notes (Signed)
Call pt: no acute findings. Continue to have some narrowing and spurring of cervical spine. If continues to be painful follow up with sports medicine.

## 2018-02-23 ENCOUNTER — Ambulatory Visit (INDEPENDENT_AMBULATORY_CARE_PROVIDER_SITE_OTHER): Payer: BLUE CROSS/BLUE SHIELD

## 2018-02-23 ENCOUNTER — Ambulatory Visit: Payer: BLUE CROSS/BLUE SHIELD | Admitting: Sports Medicine

## 2018-02-23 DIAGNOSIS — S00431D Contusion of right ear, subsequent encounter: Secondary | ICD-10-CM

## 2018-02-23 DIAGNOSIS — M79644 Pain in right finger(s): Secondary | ICD-10-CM

## 2018-02-23 DIAGNOSIS — M79671 Pain in right foot: Secondary | ICD-10-CM | POA: Diagnosis not present

## 2018-02-23 DIAGNOSIS — M79674 Pain in right toe(s): Secondary | ICD-10-CM

## 2018-02-23 NOTE — Assessment & Plan Note (Signed)
Suspect sprain, continue spica for the next 3 weeks, return afterwards. We have done a flexor pollicis longus sheath injection in the past.

## 2018-02-23 NOTE — Assessment & Plan Note (Signed)
I aspirated the hematoma at the last visit, ear looks normal today.

## 2018-02-23 NOTE — Progress Notes (Signed)
Subjective:    I'm seeing this patient as a consultation for: Iran Planas, PA-C  CC: Right foot pain  HPI: I saw this pleasant 62 year old female last week after a fall, we drained a right oral hematoma, and treated a right thumb sprain, she is improving with regards to her thumb, and the thumb spica.  Unfortunately these distracting injuries took [place] away from noting pain in her right foot, over the third metatarsal shaft distally.  She did have significant bruising and pain after the fall.  Pain is severe, persistent, localized without radiation.  I reviewed the past medical history, family history, social history, surgical history, and allergies today and no changes were needed.  Please see the problem list section below in epic for further details.  Past Medical History: Past Medical History:  Diagnosis Date  . Fibromyalgia   . GERD (gastroesophageal reflux disease)   . Hyperlipidemia 10/09/2015  . Lactose intolerance   . Nipple discharge    Past Surgical History: Past Surgical History:  Procedure Laterality Date  . BREAST SURGERY    . tubal lig     Social History: Social History   Socioeconomic History  . Marital status: Married    Spouse name: Not on file  . Number of children: Not on file  . Years of education: Not on file  . Highest education level: Not on file  Occupational History  . Not on file  Social Needs  . Financial resource strain: Not on file  . Food insecurity:    Worry: Not on file    Inability: Not on file  . Transportation needs:    Medical: Not on file    Non-medical: Not on file  Tobacco Use  . Smoking status: Never Smoker  . Smokeless tobacco: Never Used  Substance and Sexual Activity  . Alcohol use: Not on file  . Drug use: Not on file  . Sexual activity: Not on file  Lifestyle  . Physical activity:    Days per week: Not on file    Minutes per session: Not on file  . Stress: Not on file  Relationships  . Social connections:   Talks on phone: Not on file    Gets together: Not on file    Attends religious service: Not on file    Active member of club or organization: Not on file    Attends meetings of clubs or organizations: Not on file    Relationship status: Not on file  Other Topics Concern  . Not on file  Social History Narrative  . Not on file   Family History: Family History  Problem Relation Age of Onset  . Alzheimer's disease Mother   . Rheum arthritis Mother    Allergies: Allergies  Allergen Reactions  . Tramadol Itching  . Codeine Hives and Itching  . Hydrocodone Hives and Itching  . Zyrtec [Cetirizine] Other (See Comments)    Headache   Medications: See med rec.  Review of Systems: No headache, visual changes, nausea, vomiting, diarrhea, constipation, dizziness, abdominal pain, skin rash, fevers, chills, night sweats, weight loss, swollen lymph nodes, body aches, joint swelling, muscle aches, chest pain, shortness of breath, mood changes, visual or auditory hallucinations.   Objective:   General: Well Developed, well nourished, and in no acute distress.  Neuro:  Extra-ocular muscles intact, able to move all 4 extremities, sensation grossly intact.  Deep tendon reflexes tested were normal. Psych: Alert and oriented, mood congruent with affect. ENT:  Ears  and nose appear unremarkable.  Hearing grossly normal.  Right ear looks normal, oral hematoma is resolved. Neck: Unremarkable overall appearance, trachea midline.  No visible thyroid enlargement. Eyes: Conjunctivae and lids appear unremarkable.  Pupils equal and round. Skin: Warm and dry, no rashes noted.  Cardiovascular: Pulses palpable, no extremity edema. Right foot: Mild swelling and bruising Range of motion is full in all directions. Strength is 5/5 in all directions. No hallux valgus. No pes cavus or pes planus. No abnormal callus noted. No pain over the navicular prominence, or base of fifth metatarsal. No tenderness to  palpation of the calcaneal insertion of plantar fascia. No pain at the Achilles insertion. No pain over the calcaneal bursa. No pain of the retrocalcaneal bursa. Tender to palpation over the third metatarsal shaft distally No hallux rigidus or limitus. No tenderness palpation over interphalangeal joints. No pain with compression of the metatarsal heads. Neurovascularly intact distally.  X-rays personally reviewed, no evidence of fracture, this is likely just a midfoot sprain and will improve on its own.  Impression and Recommendations:   This case required medical decision making of moderate complexity.  Right foot pain Pain at the third metatarsal shaft distally, this is been present since the injury. X-rays, she does have a postop shoe at home. Return in 3 weeks for this.  X-rays personally reviewed, no evidence of fracture, this is likely just a midfoot sprain and will improve on its own.  Pain of right thumb Suspect sprain, continue spica for the next 3 weeks, return afterwards. We have done a flexor pollicis longus sheath injection in the past.  Ear hematoma, right I aspirated the hematoma at the last visit, ear looks normal today.  ___________________________________________ Gwen Her. Dianah Field, M.D., ABFM., CAQSM. Primary Care and [place] Instructor of [place] [facility]

## 2018-02-23 NOTE — Assessment & Plan Note (Addendum)
Pain at the third metatarsal shaft distally, this is been present since the injury. X-rays, she does have a postop shoe at home. Return in 3 weeks for this.  X-rays personally reviewed, no evidence of fracture, this is likely just a midfoot sprain and will improve on its own.

## 2018-02-26 ENCOUNTER — Other Ambulatory Visit: Payer: Self-pay | Admitting: Physician Assistant

## 2018-02-26 DIAGNOSIS — M25562 Pain in left knee: Secondary | ICD-10-CM

## 2018-02-26 DIAGNOSIS — W19XXXD Unspecified fall, subsequent encounter: Secondary | ICD-10-CM

## 2018-02-26 DIAGNOSIS — M79644 Pain in right finger(s): Secondary | ICD-10-CM

## 2018-02-26 DIAGNOSIS — M25511 Pain in right shoulder: Secondary | ICD-10-CM

## 2018-02-26 DIAGNOSIS — S00431A Contusion of right ear, initial encounter: Secondary | ICD-10-CM

## 2018-03-07 DIAGNOSIS — H10021 Other mucopurulent conjunctivitis, right eye: Secondary | ICD-10-CM | POA: Diagnosis not present

## 2018-03-16 ENCOUNTER — Ambulatory Visit: Payer: BLUE CROSS/BLUE SHIELD | Admitting: Sports Medicine

## 2018-03-16 ENCOUNTER — Encounter: Payer: Self-pay | Admitting: Sports Medicine

## 2018-03-16 DIAGNOSIS — M79671 Pain in right foot: Secondary | ICD-10-CM

## 2018-03-16 DIAGNOSIS — M65311 Trigger thumb, right thumb: Secondary | ICD-10-CM

## 2018-03-16 NOTE — Assessment & Plan Note (Signed)
Now resolved with postop shoe for 3 weeks.

## 2018-03-16 NOTE — Progress Notes (Signed)
Subjective:    CC: Right thumb pain  HPI: Amanda Novak returns, her foot is better, she still has pain in her right thumb, volar aspect with triggering, severe, persistent, localized without radiation, previous injection was a year and several months ago.  I reviewed the past medical history, family history, social history, surgical history, and allergies today and no changes were needed.  Please see the problem list section below in epic for further details.  Past Medical History: Past Medical History:  Diagnosis Date  . Fibromyalgia   . GERD (gastroesophageal reflux disease)   . Hyperlipidemia 10/09/2015  . Lactose intolerance   . Nipple discharge    Past Surgical History: Past Surgical History:  Procedure Laterality Date  . BREAST SURGERY    . tubal lig     Social History: Social History   Socioeconomic History  . Marital status: Married    Spouse name: Not on file  . Number of children: Not on file  . Years of education: Not on file  . Highest education level: Not on file  Occupational History  . Not on file  Social Needs  . Financial resource strain: Not on file  . Food insecurity:    Worry: Not on file    Inability: Not on file  . Transportation needs:    Medical: Not on file    Non-medical: Not on file  Tobacco Use  . Smoking status: Never Smoker  . Smokeless tobacco: Never Used  Substance and Sexual Activity  . Alcohol use: Not on file  . Drug use: Not on file  . Sexual activity: Not on file  Lifestyle  . Physical activity:    Days per week: Not on file    Minutes per session: Not on file  . Stress: Not on file  Relationships  . Social connections:    Talks on phone: Not on file    Gets together: Not on file    Attends religious service: Not on file    Active member of club or organization: Not on file    Attends meetings of clubs or organizations: Not on file    Relationship status: Not on file  Other Topics Concern  . Not on file  Social History  Narrative  . Not on file   Family History: Family History  Problem Relation Age of Onset  . Alzheimer's disease Mother   . Rheum arthritis Mother    Allergies: Allergies  Allergen Reactions  . Tramadol Itching  . Codeine Hives and Itching  . Hydrocodone Hives and Itching  . Zyrtec [Cetirizine] Other (See Comments)    Headache   Medications: See med rec.  Review of Systems: No fevers, chills, night sweats, weight loss, chest pain, or shortness of breath.   Objective:    General: Well Developed, well nourished, and in no acute distress.  Neuro: Alert and oriented x3, extra-ocular muscles intact, sensation grossly intact.  HEENT: Normocephalic, atraumatic, pupils equal round reactive to light, neck supple, no masses, no lymphadenopathy, thyroid nonpalpable.  Skin: Warm and dry, no rashes. Cardiac: Regular rate and rhythm, no murmurs rubs or gallops, no lower extremity edema.  Respiratory: Clear to auscultation bilaterally. Not using accessory muscles, speaking in full sentences. Right hand: Tender to palpation over the flexor pollicis longus at the level of the MCP, palpable and visible triggering.  Procedure: Real-time Ultrasound Guided Injection of right flexor pollicis longus tendon sheath Device: GE Logiq E  Verbal informed consent obtained.  Time-out conducted.  Noted  no overlying erythema, induration, or other signs of local infection.  Skin prepped in a sterile fashion.  Local anesthesia: Topical Ethyl chloride.  With sterile technique and under real time ultrasound guidance: 25-gauge needle advanced into the tendon sheath, injected 1/2 cc kenalog 40, 1/2 cc lidocaine. Completed without difficulty  Pain immediately resolved suggesting accurate placement of the medication.  Advised to call if fevers/chills, erythema, induration, drainage, or persistent bleeding.  Images permanently stored and available for review in the ultrasound unit.  Impression: Technically  successful ultrasound guided injection.  Impression and Recommendations:    Trigger thumb, right thumb Right flexor pollicis longus tendon sheath injection, previous injection was in April 2018. Return in 1 month.  Right foot pain Now resolved with postop shoe for 3 weeks. ___________________________________________ Gwen Her. Dianah Field, M.D., ABFM., CAQSM. Primary Care and [place] Instructor of [place] [facility] of Medicine

## 2018-03-16 NOTE — Assessment & Plan Note (Signed)
Right flexor pollicis longus tendon sheath injection, previous injection was in April 2018. Return in 1 month.

## 2018-03-29 ENCOUNTER — Telehealth: Payer: Self-pay

## 2018-03-29 NOTE — Telephone Encounter (Addendum)
Amanda Novak is calling about paperwork she dropped off.

## 2018-03-29 NOTE — Telephone Encounter (Signed)
Amanda B. Is handling this.Amanda Novak, Amanda Novak

## 2018-03-30 ENCOUNTER — Encounter: Payer: Self-pay | Admitting: Sports Medicine

## 2018-03-30 ENCOUNTER — Other Ambulatory Visit: Payer: Self-pay | Admitting: Physician Assistant

## 2018-03-30 DIAGNOSIS — M25562 Pain in left knee: Secondary | ICD-10-CM

## 2018-03-30 DIAGNOSIS — W19XXXD Unspecified fall, subsequent encounter: Secondary | ICD-10-CM

## 2018-03-30 DIAGNOSIS — M79644 Pain in right finger(s): Secondary | ICD-10-CM

## 2018-03-30 DIAGNOSIS — M25511 Pain in right shoulder: Secondary | ICD-10-CM

## 2018-03-30 DIAGNOSIS — S00431A Contusion of right ear, initial encounter: Secondary | ICD-10-CM

## 2018-04-01 NOTE — Telephone Encounter (Signed)
Amanda Novak spoke to pt and pt has dropped off another copy. Form has been faxed.

## 2018-04-02 DIAGNOSIS — M1612 Unilateral primary osteoarthritis, left hip: Secondary | ICD-10-CM | POA: Diagnosis not present

## 2018-04-02 DIAGNOSIS — D1809 Hemangioma of other sites: Secondary | ICD-10-CM | POA: Diagnosis not present

## 2018-04-09 DIAGNOSIS — D1809 Hemangioma of other sites: Secondary | ICD-10-CM | POA: Diagnosis not present

## 2018-04-09 DIAGNOSIS — M898X5 Other specified disorders of bone, thigh: Secondary | ICD-10-CM | POA: Diagnosis not present

## 2018-04-15 ENCOUNTER — Ambulatory Visit (INDEPENDENT_AMBULATORY_CARE_PROVIDER_SITE_OTHER): Payer: BLUE CROSS/BLUE SHIELD

## 2018-04-15 ENCOUNTER — Encounter: Payer: Self-pay | Admitting: Sports Medicine

## 2018-04-15 ENCOUNTER — Ambulatory Visit: Payer: BLUE CROSS/BLUE SHIELD | Admitting: Sports Medicine

## 2018-04-15 DIAGNOSIS — M79644 Pain in right finger(s): Secondary | ICD-10-CM

## 2018-04-15 DIAGNOSIS — R200 Anesthesia of skin: Secondary | ICD-10-CM

## 2018-04-15 DIAGNOSIS — M65311 Trigger thumb, right thumb: Secondary | ICD-10-CM | POA: Diagnosis not present

## 2018-04-15 NOTE — Progress Notes (Signed)
Subjective:    I'm seeing this patient as a consultation for: Iran Planas, PA-C  CC: Right hand pain  HPI: For years this pleasant 62 year old female has had pain in her right hand, moderate, persistent, localized over the volar wrist with radiation to the thenar eminence, thumb, second and third fingers, drops things occasionally.  She does have significant nocturnal symptoms.  Trigger thumb: Resolved at the last visit.  I reviewed the past medical history, family history, social history, surgical history, and allergies today and no changes were needed.  Please see the problem list section below in epic for further details.  Past Medical History: Past Medical History:  Diagnosis Date  . Fibromyalgia   . GERD (gastroesophageal reflux disease)   . Hyperlipidemia 10/09/2015  . Lactose intolerance   . Nipple discharge    Past Surgical History: Past Surgical History:  Procedure Laterality Date  . BREAST SURGERY    . tubal lig     Social History: Social History   Socioeconomic History  . Marital status: Married    Spouse name: Not on file  . Number of children: Not on file  . Years of education: Not on file  . Highest education level: Not on file  Occupational History  . Not on file  Social Needs  . Financial resource strain: Not on file  . Food insecurity:    Worry: Not on file    Inability: Not on file  . Transportation needs:    Medical: Not on file    Non-medical: Not on file  Tobacco Use  . Smoking status: Never Smoker  . Smokeless tobacco: Never Used  Substance and Sexual Activity  . Alcohol use: Not on file  . Drug use: Not on file  . Sexual activity: Not on file  Lifestyle  . Physical activity:    Days per week: Not on file    Minutes per session: Not on file  . Stress: Not on file  Relationships  . Social connections:    Talks on phone: Not on file    Gets together: Not on file    Attends religious service: Not on file    Active member of club  or organization: Not on file    Attends meetings of clubs or organizations: Not on file    Relationship status: Not on file  Other Topics Concern  . Not on file  Social History Narrative  . Not on file   Family History: Family History  Problem Relation Age of Onset  . Alzheimer's disease Mother   . Rheum arthritis Mother    Allergies: Allergies  Allergen Reactions  . Tramadol Itching  . Codeine Hives and Itching  . Hydrocodone Hives and Itching  . Zyrtec [Cetirizine] Other (See Comments)    Headache   Medications: See med rec.  Review of Systems: No headache, visual changes, nausea, vomiting, diarrhea, constipation, dizziness, abdominal pain, skin rash, fevers, chills, night sweats, weight loss, swollen lymph nodes, body aches, joint swelling, muscle aches, chest pain, shortness of breath, mood changes, visual or auditory hallucinations.   Objective:   General: Well Developed, well nourished, and in no acute distress.  Neuro:  Extra-ocular muscles intact, able to move all 4 extremities, sensation grossly intact.  Deep tendon reflexes tested were normal. Psych: Alert and oriented, mood congruent with affect. ENT:  Ears and nose appear unremarkable.  Hearing grossly normal. Neck: Unremarkable overall appearance, trachea midline.  No visible thyroid enlargement. Eyes: Conjunctivae and lids appear  unremarkable.  Pupils equal and round. Skin: Warm and dry, no rashes noted.  Cardiovascular: Pulses palpable, no extremity edema. Right wrist: Inspection normal with no visible erythema or swelling. ROM smooth and normal with good flexion and extension and ulnar/radial deviation that is symmetrical with opposite wrist. Palpation is normal over metacarpals, navicular, lunate, and TFCC; tendons without tenderness/ swelling No snuffbox tenderness. No tenderness over Canal of Guyon. Strength 5/5 in all directions without pain. Positive Tinel sign. Negative Finkelstein sign. Negative  Watson's test.  Impression and Recommendations:   This case required medical decision making of moderate complexity.  Trigger thumb, right thumb Resolved after injection at the last visit  Pain of right thumb Describing weakness of the right hand, numbness and tingling in the thenar eminence, thumb, second and third fingers. She does a lot of data entry, drops objects, symptoms are nocturnal and in the morning, all consistent with carpal tunnel syndrome. She does have a thumb spica brace, she will wear this every night for the next month. Return to see me in 1 month, Hydro dissection if no better. ___________________________________________ Gwen Her. Dianah Field, M.D., ABFM., CAQSM. Primary Care and [place] Instructor of [place] of [facility] of Medicine

## 2018-04-15 NOTE — Assessment & Plan Note (Signed)
Resolved after injection at the last visit 

## 2018-04-15 NOTE — Assessment & Plan Note (Signed)
Describing weakness of the right hand, numbness and tingling in the thenar eminence, thumb, second and third fingers. She does a lot of data entry, drops objects, symptoms are nocturnal and in the morning, all consistent with carpal tunnel syndrome. She does have a thumb spica brace, she will wear this every night for the next month. Return to see me in 1 month, Hydro dissection if no better.

## 2018-04-28 ENCOUNTER — Other Ambulatory Visit: Payer: Self-pay | Admitting: Sports Medicine

## 2018-04-28 DIAGNOSIS — M79644 Pain in right finger(s): Secondary | ICD-10-CM

## 2018-04-28 DIAGNOSIS — W19XXXD Unspecified fall, subsequent encounter: Secondary | ICD-10-CM

## 2018-04-28 DIAGNOSIS — S00431A Contusion of right ear, initial encounter: Secondary | ICD-10-CM

## 2018-04-28 DIAGNOSIS — M25511 Pain in right shoulder: Secondary | ICD-10-CM

## 2018-04-28 DIAGNOSIS — M25562 Pain in left knee: Secondary | ICD-10-CM

## 2018-05-17 ENCOUNTER — Ambulatory Visit: Payer: BLUE CROSS/BLUE SHIELD | Admitting: Sports Medicine

## 2018-05-21 ENCOUNTER — Other Ambulatory Visit: Payer: Self-pay | Admitting: Physician Assistant

## 2018-06-02 ENCOUNTER — Other Ambulatory Visit: Payer: Self-pay | Admitting: Sports Medicine

## 2018-06-02 DIAGNOSIS — W19XXXD Unspecified fall, subsequent encounter: Secondary | ICD-10-CM

## 2018-06-02 DIAGNOSIS — S00431A Contusion of right ear, initial encounter: Secondary | ICD-10-CM

## 2018-06-02 DIAGNOSIS — M79644 Pain in right finger(s): Secondary | ICD-10-CM

## 2018-06-02 DIAGNOSIS — M25511 Pain in right shoulder: Secondary | ICD-10-CM

## 2018-06-02 DIAGNOSIS — M25562 Pain in left knee: Secondary | ICD-10-CM

## 2018-06-17 ENCOUNTER — Ambulatory Visit: Payer: BLUE CROSS/BLUE SHIELD | Admitting: Sports Medicine

## 2018-06-17 VITALS — BP 139/84 | HR 71 | Ht 64.0 in | Wt 171.0 lb

## 2018-06-17 DIAGNOSIS — M19032 Primary osteoarthritis, left wrist: Secondary | ICD-10-CM | POA: Insufficient documentation

## 2018-06-17 DIAGNOSIS — R2232 Localized swelling, mass and lump, left upper limb: Secondary | ICD-10-CM

## 2018-06-17 DIAGNOSIS — Z23 Encounter for immunization: Secondary | ICD-10-CM | POA: Diagnosis not present

## 2018-06-17 MED ORDER — PREDNISONE 50 MG PO TABS: ORAL_TABLET | ORAL | 0 refills | Status: DC

## 2018-06-17 NOTE — Addendum Note (Signed)
Addended by: Dessie Coma on: 06/17/2018 05:41 PM   Modules accepted: Orders

## 2018-06-17 NOTE — Assessment & Plan Note (Signed)
Pain over the dorsal radiocarpal joint. Adding 5 days of prednisone and she has a Velcro wrist brace at home. She does also however have a mass over the dorsum of her wrist just proximal to the radiocarpal joint. Minimally tender to palpation and not extremely well-defined. Because of the risk of liposarcoma considering the ill-defined nature of this mass we are going to proceed with x-ray and an MRI with IV contrast. Adding a BMP beforehand.

## 2018-06-17 NOTE — Progress Notes (Signed)
Subjective:    I'm seeing this patient as a consultation for: Amanda Planas, PA-C  CC: Left wrist mass  HPI: For the past several weeks this pleasant 62 year old female has had pain in her left wrist over the dorsum, she has been working a lot, cooking and carrying food trays.  She has had a mass over the dorsum of her left wrist as well for a while, it is starting to become painful.  Symptoms are moderate, persistent, localized without radiation.  We are also treating her right wrist discomfort, suspected clinically to be carpal tunnel syndrome, we did a Velcro wrist brace for nocturnal use and her symptoms have resolved.  I reviewed the past medical history, family history, social history, surgical history, and allergies today and no changes were needed.  Please see the problem list section below in epic for further details.  Past Medical History: Past Medical History:  Diagnosis Date  . Fibromyalgia   . GERD (gastroesophageal reflux disease)   . Hyperlipidemia 10/09/2015  . Lactose intolerance   . Nipple discharge    Past Surgical History: Past Surgical History:  Procedure Laterality Date  . BREAST SURGERY    . tubal lig     Social History: Social History   Socioeconomic History  . Marital status: Married    Spouse name: Not on file  . Number of children: Not on file  . Years of education: Not on file  . Highest education level: Not on file  Occupational History  . Not on file  Social Needs  . Financial resource strain: Not on file  . Food insecurity:    Worry: Not on file    Inability: Not on file  . Transportation needs:    Medical: Not on file    Non-medical: Not on file  Tobacco Use  . Smoking status: Never Smoker  . Smokeless tobacco: Never Used  Substance and Sexual Activity  . Alcohol use: Not on file  . Drug use: Not on file  . Sexual activity: Not on file  Lifestyle  . Physical activity:    Days per week: Not on file    Minutes per session: Not  on file  . Stress: Not on file  Relationships  . Social connections:    Talks on phone: Not on file    Gets together: Not on file    Attends religious service: Not on file    Active member of club or organization: Not on file    Attends meetings of clubs or organizations: Not on file    Relationship status: Not on file  Other Topics Concern  . Not on file  Social History Narrative  . Not on file   Family History: Family History  Problem Relation Age of Onset  . Alzheimer's disease Mother   . Rheum arthritis Mother    Allergies: Allergies  Allergen Reactions  . Tramadol Itching  . Codeine Hives and Itching  . Hydrocodone Hives and Itching  . Zyrtec [Cetirizine] Other (See Comments)    Headache   Medications: See med rec.  Review of Systems: No headache, visual changes, nausea, vomiting, diarrhea, constipation, dizziness, abdominal pain, skin rash, fevers, chills, night sweats, weight loss, swollen lymph nodes, body aches, joint swelling, muscle aches, chest pain, shortness of breath, mood changes, visual or auditory hallucinations.   Objective:   General: Well Developed, well nourished, and in no acute distress.  Neuro:  Extra-ocular muscles intact, able to move all 4 extremities, sensation grossly  intact.  Deep tendon reflexes tested were normal. Psych: Alert and oriented, mood congruent with affect. ENT:  Ears and nose appear unremarkable.  Hearing grossly normal. Neck: Unremarkable overall appearance, trachea midline.  No visible thyroid enlargement. Eyes: Conjunctivae and lids appear unremarkable.  Pupils equal and round. Skin: Warm and dry, no rashes noted.  Cardiovascular: Pulses palpable, no extremity edema. Left wrist: Ill-defined mass over the dorsum of the left wrist, proximal to the radiocarpal joint. ROM smooth and normal with good flexion and extension and ulnar/radial deviation that is symmetrical with opposite wrist. Palpation is normal over metacarpals,  navicular, lunate, and TFCC; tendons without tenderness/ swelling Minimal tenderness at the radiocarpal joint as well as over the dorsal wrist mass. No snuffbox tenderness. No tenderness over Canal of Guyon. Strength 5/5 in all directions without pain. Negative tinel's and phalens signs. Negative Finkelstein sign. Negative Watson's test.  Impression and Recommendations:   This case required medical decision making of moderate complexity.  Mass of wrist, left Pain over the dorsal radiocarpal joint. Adding 5 days of prednisone and she has a Velcro wrist brace at home. She does also however have a mass over the dorsum of her wrist just proximal to the radiocarpal joint. Minimally tender to palpation and not extremely well-defined. Because of the risk of liposarcoma considering the ill-defined nature of this mass we are going to proceed with x-ray and an MRI with IV contrast. Adding a BMP beforehand. ___________________________________________ Gwen Her. Dianah Field, M.D., ABFM., CAQSM. Primary Care and [place] Instructor of [place] of [facility] of Medicine

## 2018-06-17 NOTE — Addendum Note (Signed)
Addended by: Dessie Coma on: 06/17/2018 03:18 PM   Modules accepted: Orders

## 2018-06-18 LAB — BASIC METABOLIC PANEL WITH GFR
CO2: 29 mmol/L (ref 20–32)
Calcium: 9.3 mg/dL (ref 8.6–10.4)
Chloride: 105 mmol/L (ref 98–110)
Creat: 0.84 mg/dL (ref 0.50–0.99)

## 2018-06-18 LAB — BASIC METABOLIC PANEL
BUN: 13 mg/dL (ref 7–25)
Glucose, Bld: 104 mg/dL — ABNORMAL HIGH (ref 65–99)
Potassium: 4.2 mmol/L (ref 3.5–5.3)
Sodium: 142 mmol/L (ref 135–146)

## 2018-07-06 DIAGNOSIS — M25432 Effusion, left wrist: Secondary | ICD-10-CM | POA: Diagnosis not present

## 2018-07-06 DIAGNOSIS — M948X3 Other specified disorders of cartilage, forearm: Secondary | ICD-10-CM | POA: Diagnosis not present

## 2018-07-06 DIAGNOSIS — S63592A Other specified sprain of left wrist, initial encounter: Secondary | ICD-10-CM | POA: Diagnosis not present

## 2018-07-06 DIAGNOSIS — M8588 Other specified disorders of bone density and structure, other site: Secondary | ICD-10-CM | POA: Diagnosis not present

## 2018-07-06 DIAGNOSIS — M19032 Primary osteoarthritis, left wrist: Secondary | ICD-10-CM | POA: Diagnosis not present

## 2018-07-13 ENCOUNTER — Ambulatory Visit (INDEPENDENT_AMBULATORY_CARE_PROVIDER_SITE_OTHER): Payer: BLUE CROSS/BLUE SHIELD | Admitting: Sports Medicine

## 2018-07-13 ENCOUNTER — Encounter: Payer: Self-pay | Admitting: Sports Medicine

## 2018-07-13 DIAGNOSIS — E538 Deficiency of other specified B group vitamins: Secondary | ICD-10-CM | POA: Diagnosis not present

## 2018-07-13 DIAGNOSIS — K219 Gastro-esophageal reflux disease without esophagitis: Secondary | ICD-10-CM

## 2018-07-13 DIAGNOSIS — M19032 Primary osteoarthritis, left wrist: Secondary | ICD-10-CM | POA: Diagnosis not present

## 2018-07-13 MED ORDER — FAMOTIDINE 40 MG PO TABS: 40.0000 mg | ORAL_TABLET | Freq: Two times a day (BID) | ORAL | 3 refills | Status: DC

## 2018-07-13 MED ORDER — PANTOPRAZOLE SODIUM 40 MG PO TBEC: 40.0000 mg | DELAYED_RELEASE_TABLET | Freq: Two times a day (BID) | ORAL | 3 refills | Status: DC

## 2018-07-13 MED ORDER — SUCRALFATE 1 G PO TABS: 1.0000 g | ORAL_TABLET | Freq: Four times a day (QID) | ORAL | 0 refills | Status: DC

## 2018-07-13 NOTE — Assessment & Plan Note (Signed)
Increasing epigastric discomfort without melena or hematochezia. Increase Protonix to twice a day and Pepcid to twice a day for 2 weeks, and adding Carafate. Follow-up in 2 to 4 weeks with Dr. Glennon Hamilton with gastroenterology.

## 2018-07-13 NOTE — Progress Notes (Signed)
Subjective:    CC: MRI results, epigastric pain  HPI: Amanda Novak returns, she is had left wrist pain for a long time now.  We obtained an MRI that showed multiple degenerative processes the results of which will be dictated below.  Pain is moderate, persistent, localized to the dorsal radiocarpal joint, worse with gripping motions and wrist extension.  No mechanical symptoms, no trauma.  She has a history of GERD, currently taking pantoprazole and Pepcid.  No melena, hematochezia.  Having an increased amount of epigastric discomfort, sensation of bloating and fullness, no nausea or vomiting.  I reviewed the past medical history, family history, social history, surgical history, and allergies today and no changes were needed.  Please see the problem list section below in epic for further details.  Past Medical History: Past Medical History:  Diagnosis Date  . Fibromyalgia   . GERD (gastroesophageal reflux disease)   . Hyperlipidemia 10/09/2015  . Lactose intolerance   . Nipple discharge    Past Surgical History: Past Surgical History:  Procedure Laterality Date  . BREAST SURGERY    . tubal lig     Social History: Social History   Socioeconomic History  . Marital status: Married    Spouse name: Not on file  . Number of children: Not on file  . Years of education: Not on file  . Highest education level: Not on file  Occupational History  . Not on file  Social Needs  . Financial resource strain: Not on file  . Food insecurity:    Worry: Not on file    Inability: Not on file  . Transportation needs:    Medical: Not on file    Non-medical: Not on file  Tobacco Use  . Smoking status: Never Smoker  . Smokeless tobacco: Never Used  Substance and Sexual Activity  . Alcohol use: Not on file  . Drug use: Not on file  . Sexual activity: Not on file  Lifestyle  . Physical activity:    Days per week: Not on file    Minutes per session: Not on file  . Stress: Not on file    Relationships  . Social connections:    Talks on phone: Not on file    Gets together: Not on file    Attends religious service: Not on file    Active member of club or organization: Not on file    Attends meetings of clubs or organizations: Not on file    Relationship status: Not on file  Other Topics Concern  . Not on file  Social History Narrative  . Not on file   Family History: Family History  Problem Relation Age of Onset  . Alzheimer's disease Mother   . Rheum arthritis Mother    Allergies: Allergies  Allergen Reactions  . Tramadol Itching  . Codeine Hives and Itching  . Hydrocodone Hives and Itching  . Zyrtec [Cetirizine] Other (See Comments)    Headache   Medications: See med rec.  Review of Systems: No fevers, chills, night sweats, weight loss, chest pain, or shortness of breath.   Objective:    General: Well Developed, well nourished, and in no acute distress.  Neuro: Alert and oriented x3, extra-ocular muscles intact, sensation grossly intact.  HEENT: Normocephalic, atraumatic, pupils equal round reactive to light, neck supple, no masses, no lymphadenopathy, thyroid nonpalpable.  Skin: Warm and dry, no rashes. Cardiac: Regular rate and rhythm, no murmurs rubs or gallops, no lower extremity edema.  Respiratory: Clear  to auscultation bilaterally. Not using accessory muscles, speaking in full sentences. Left wrist: Fullness over the dorsum of the wrist ROM smooth and normal with good flexion and extension and ulnar/radial deviation that is symmetrical with opposite wrist. Palpation is normal over metacarpals, navicular, lunate, and TFCC; tendons without tenderness/ swelling. Tenderness directly at the radiocarpal joint reproduced with terminal wrist extension. No snuffbox tenderness. No tenderness over Canal of Guyon. Strength 5/5 in all directions without pain. Negative tinel's and phalens signs. Negative Finkelstein sign. Negative Watson's  test.  Procedure: Real-time Ultrasound Guided Injection of left radiocarpal joint Device: GE Logiq E  Verbal informed consent obtained.  Time-out conducted.  Noted no overlying erythema, induration, or other signs of local infection.  Skin prepped in a sterile fashion.  Local anesthesia: Topical Ethyl chloride.  With sterile technique and under real time ultrasound guidance: 25-gauge needle advanced into the radiocarpal joint, injected 1 cc Kenalog 40, 1 cc lidocaine. Completed without difficulty  Pain immediately resolved suggesting accurate placement of the medication.  Advised to call if fevers/chills, erythema, induration, drainage, or persistent bleeding.  Images permanently stored and available for review in the ultrasound unit.  Impression: Technically successful ultrasound guided injection.  Impression and Recommendations:    Primary osteoarthritis of left wrist MRI showed multiple degenerative processes including radiocarpal and distal radial ulnar joint osteoarthritis. There was a central perforation, degenerative in the TFCC. Pain is referrable more to the radiocarpal joint today. She had a negative rheumatoid work-up. Injection placed today into the radiocarpal joint, return to see me in 1 month for this.  GERD (gastroesophageal reflux disease) Increasing epigastric discomfort without melena or hematochezia. Increase Protonix to twice a day and Pepcid to twice a day for 2 weeks, and adding Carafate. Follow-up in 2 to 4 weeks with Dr. Glennon Hamilton with gastroenterology. ___________________________________________ Amanda Novak. Dianah Field, M.D., ABFM., CAQSM. Primary Care and Sports Medicine [facility]  Adjunct Professor of [place] of [facility] of Medicine

## 2018-07-13 NOTE — Assessment & Plan Note (Signed)
MRI showed multiple degenerative processes including radiocarpal and distal radial ulnar joint osteoarthritis. There was a central perforation, degenerative in the TFCC. Pain is referrable more to the radiocarpal joint today. She had a negative rheumatoid work-up. Injection placed today into the radiocarpal joint, return to see me in 1 month for this.

## 2018-08-10 ENCOUNTER — Ambulatory Visit: Payer: BLUE CROSS/BLUE SHIELD | Admitting: Sports Medicine

## 2018-08-10 ENCOUNTER — Encounter: Payer: Self-pay | Admitting: Sports Medicine

## 2018-08-10 DIAGNOSIS — G5602 Carpal tunnel syndrome, left upper limb: Secondary | ICD-10-CM

## 2018-08-10 DIAGNOSIS — M19032 Primary osteoarthritis, left wrist: Secondary | ICD-10-CM

## 2018-08-10 DIAGNOSIS — G5601 Carpal tunnel syndrome, right upper limb: Secondary | ICD-10-CM | POA: Insufficient documentation

## 2018-08-10 NOTE — Assessment & Plan Note (Signed)
Median nerve hydrodissection, continue splinting at night only but not during the day. Home rehab exercises given.

## 2018-08-10 NOTE — Progress Notes (Signed)
Subjective:    CC: Left wrist pain  HPI: This is a pleasant 62 year old female, she returns, an MRI showed multiple degenerative changes in her wrist, her pain was referrable mostly to the radiocarpal joints and this was injected the last visit, overall her pain is improved, she does continue to complain of some discomfort on the volar wrist with numbness and tingling in her fingers.  She has been wearing her wrist brace 24 hours a day, still has some weakness but tells me she cannot afford physical therapy.  I reviewed the past medical history, family history, social history, surgical history, and allergies today and no changes were needed.  Please see the problem list section below in epic for further details.  Past Medical History: Past Medical History:  Diagnosis Date  . Fibromyalgia   . GERD (gastroesophageal reflux disease)   . Hyperlipidemia 10/09/2015  . Lactose intolerance   . Nipple discharge    Past Surgical History: Past Surgical History:  Procedure Laterality Date  . BREAST SURGERY    . tubal lig     Social History: Social History   Socioeconomic History  . Marital status: Married    Spouse name: Not on file  . Number of children: Not on file  . Years of education: Not on file  . Highest education level: Not on file  Occupational History  . Not on file  Social Needs  . Financial resource strain: Not on file  . Food insecurity:    Worry: Not on file    Inability: Not on file  . Transportation needs:    Medical: Not on file    Non-medical: Not on file  Tobacco Use  . Smoking status: Never Smoker  . Smokeless tobacco: Never Used  Substance and Sexual Activity  . Alcohol use: Not on file  . Drug use: Not on file  . Sexual activity: Not on file  Lifestyle  . Physical activity:    Days per week: Not on file    Minutes per session: Not on file  . Stress: Not on file  Relationships  . Social connections:    Talks on phone: Not on file    Gets together:  Not on file    Attends religious service: Not on file    Active member of club or organization: Not on file    Attends meetings of clubs or organizations: Not on file    Relationship status: Not on file  Other Topics Concern  . Not on file  Social History Narrative  . Not on file   Family History: Family History  Problem Relation Age of Onset  . Alzheimer's disease Mother   . Rheum arthritis Mother    Allergies: Allergies  Allergen Reactions  . Tramadol Itching  . Codeine Hives and Itching  . Hydrocodone Hives and Itching  . Zyrtec [Cetirizine] Other (See Comments)    Headache   Medications: See med rec.  Review of Systems: No fevers, chills, night sweats, weight loss, chest pain, or shortness of breath.   Objective:    General: Well Developed, well nourished, and in no acute distress.  Neuro: Alert and oriented x3, extra-ocular muscles intact, sensation grossly intact.  HEENT: Normocephalic, atraumatic, pupils equal round reactive to light, neck supple, no masses, no lymphadenopathy, thyroid nonpalpable.  Skin: Warm and dry, no rashes. Cardiac: Regular rate and rhythm, no murmurs rubs or gallops, no lower extremity edema.  Respiratory: Clear to auscultation bilaterally. Not using accessory muscles, speaking in  full sentences. Left wrist: Inspection normal with no visible erythema or swelling. ROM smooth and normal with good flexion and extension and ulnar/radial deviation that is symmetrical with opposite wrist. Palpation is normal over metacarpals, navicular, lunate, and TFCC; tendons without tenderness/ swelling No snuffbox tenderness. No tenderness over Canal of Guyon. Strength 5/5 in all directions without pain. Positive Tinel's and phalens signs. Negative Finkelstein sign. Negative Watson's test.  Procedure: Real-time Ultrasound Guided hydrodissection of the left median nerve at the carpal tunnel Device: GE Logiq E  Verbal informed consent obtained.  Time-out  conducted.  Noted no overlying erythema, induration, or other signs of local infection.  Skin prepped in a sterile fashion.  Local anesthesia: Topical Ethyl chloride.  With sterile technique and under real time ultrasound guidance: Using a 25-gauge needle advanced into the carpal tunnel, taking care to avoid intraneural injection I injected medication both superficial to and deep to the median nerve freeing it from surrounding structures, I then redirected the needle deep and injected further medication around the flexor tendons deep within the carpal tunnel for a total of 1 cc kenalog 40, 5 cc lidocaine. Completed without difficulty  Advised to call if fevers/chills, erythema, induration, drainage, or persistent bleeding.  Images permanently stored and available for review in the ultrasound unit.  Impression: Technically successful ultrasound guided median nerve hydrodissection.  Impression and Recommendations:    Carpal tunnel syndrome on left Median nerve hydrodissection, continue splinting at night only but not during the day. Home rehab exercises given.  Primary osteoarthritis of left wrist Radiocarpal injection at the last visit provided good relief of dorsal radiocarpal pain, discontinue brace. Next step would be wrist joint fusion. ___________________________________________ Gwen Her. Dianah Field, M.D., ABFM., CAQSM. Primary Care and Sports Medicine [facility]  Adjunct Professor of [place] of [facility] of Medicine

## 2018-08-10 NOTE — Assessment & Plan Note (Signed)
Radiocarpal injection at the last visit provided good relief of dorsal radiocarpal pain, discontinue brace. Next step would be wrist joint fusion.

## 2018-08-11 IMAGING — DX DG FINGER THUMB 2+V*R*
3 series · 3 of 3 positions shown · non-contrast
Comparison: Radiographs December 03, 2016.

CLINICAL DATA: Right thumb pain and swelling after fall yesterday.

EXAM:
RIGHT THUMB 2+V

[finger ap]
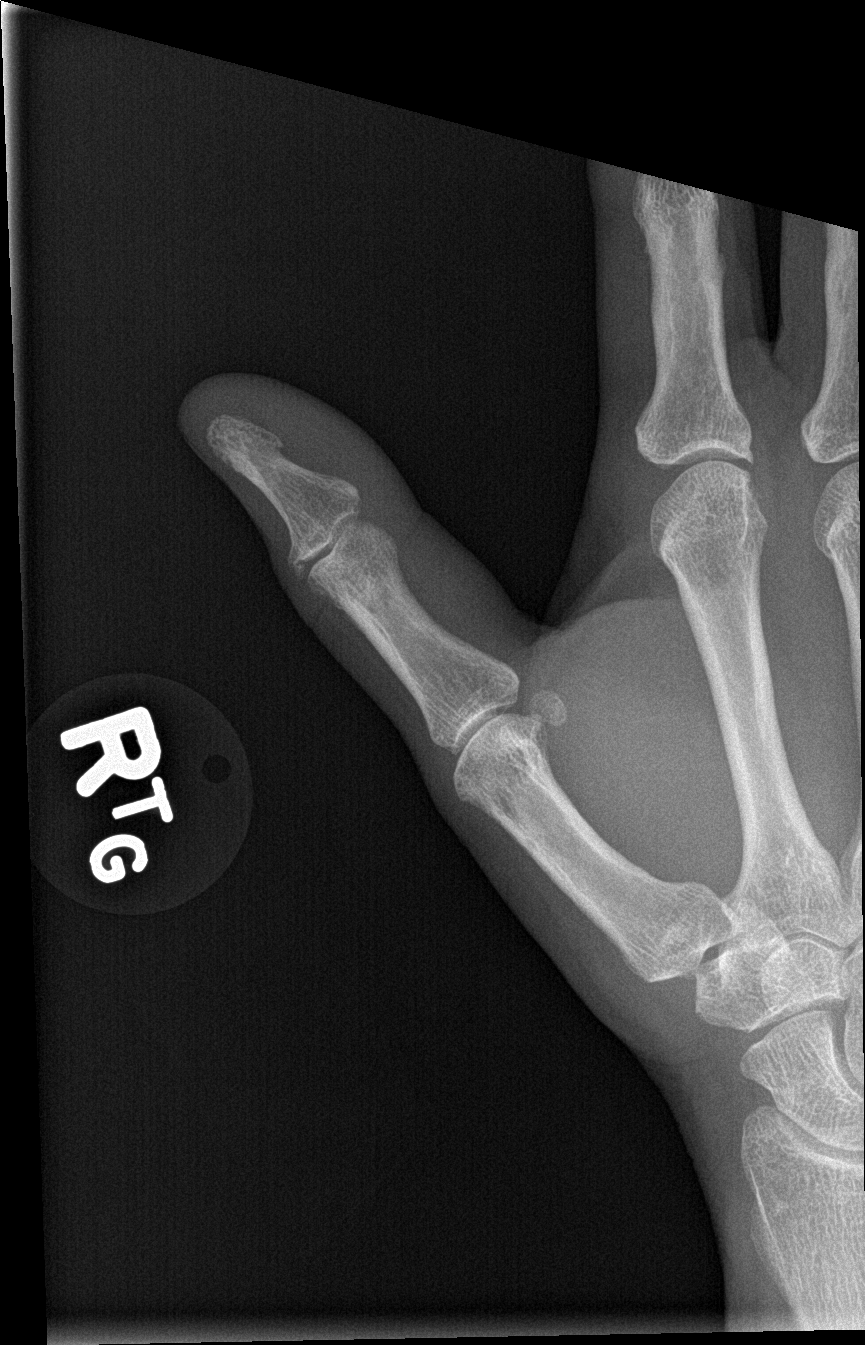

[finger obl]
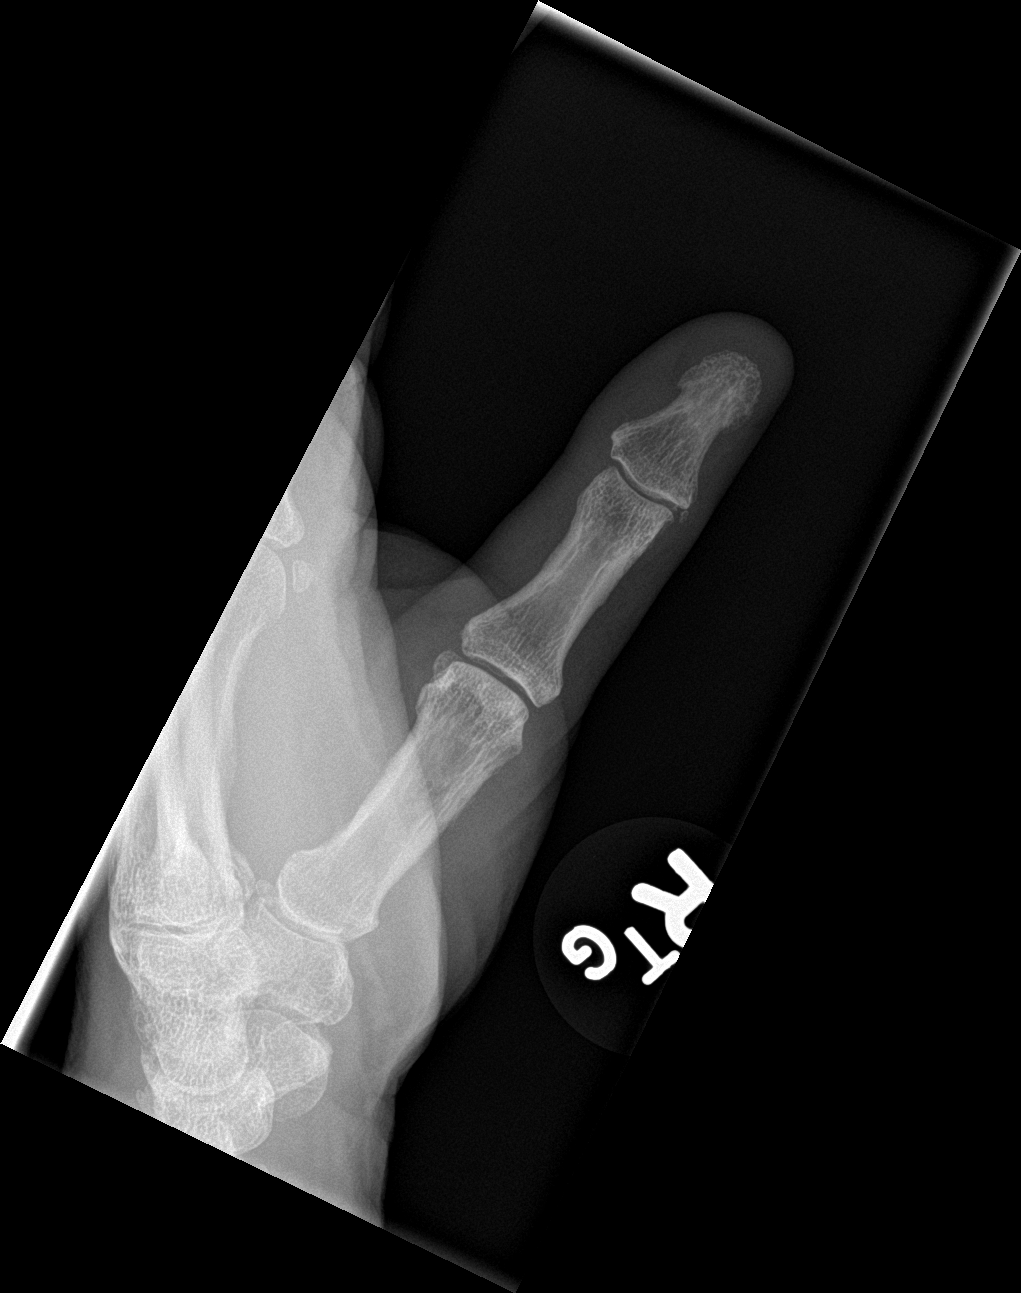

[finger lat]
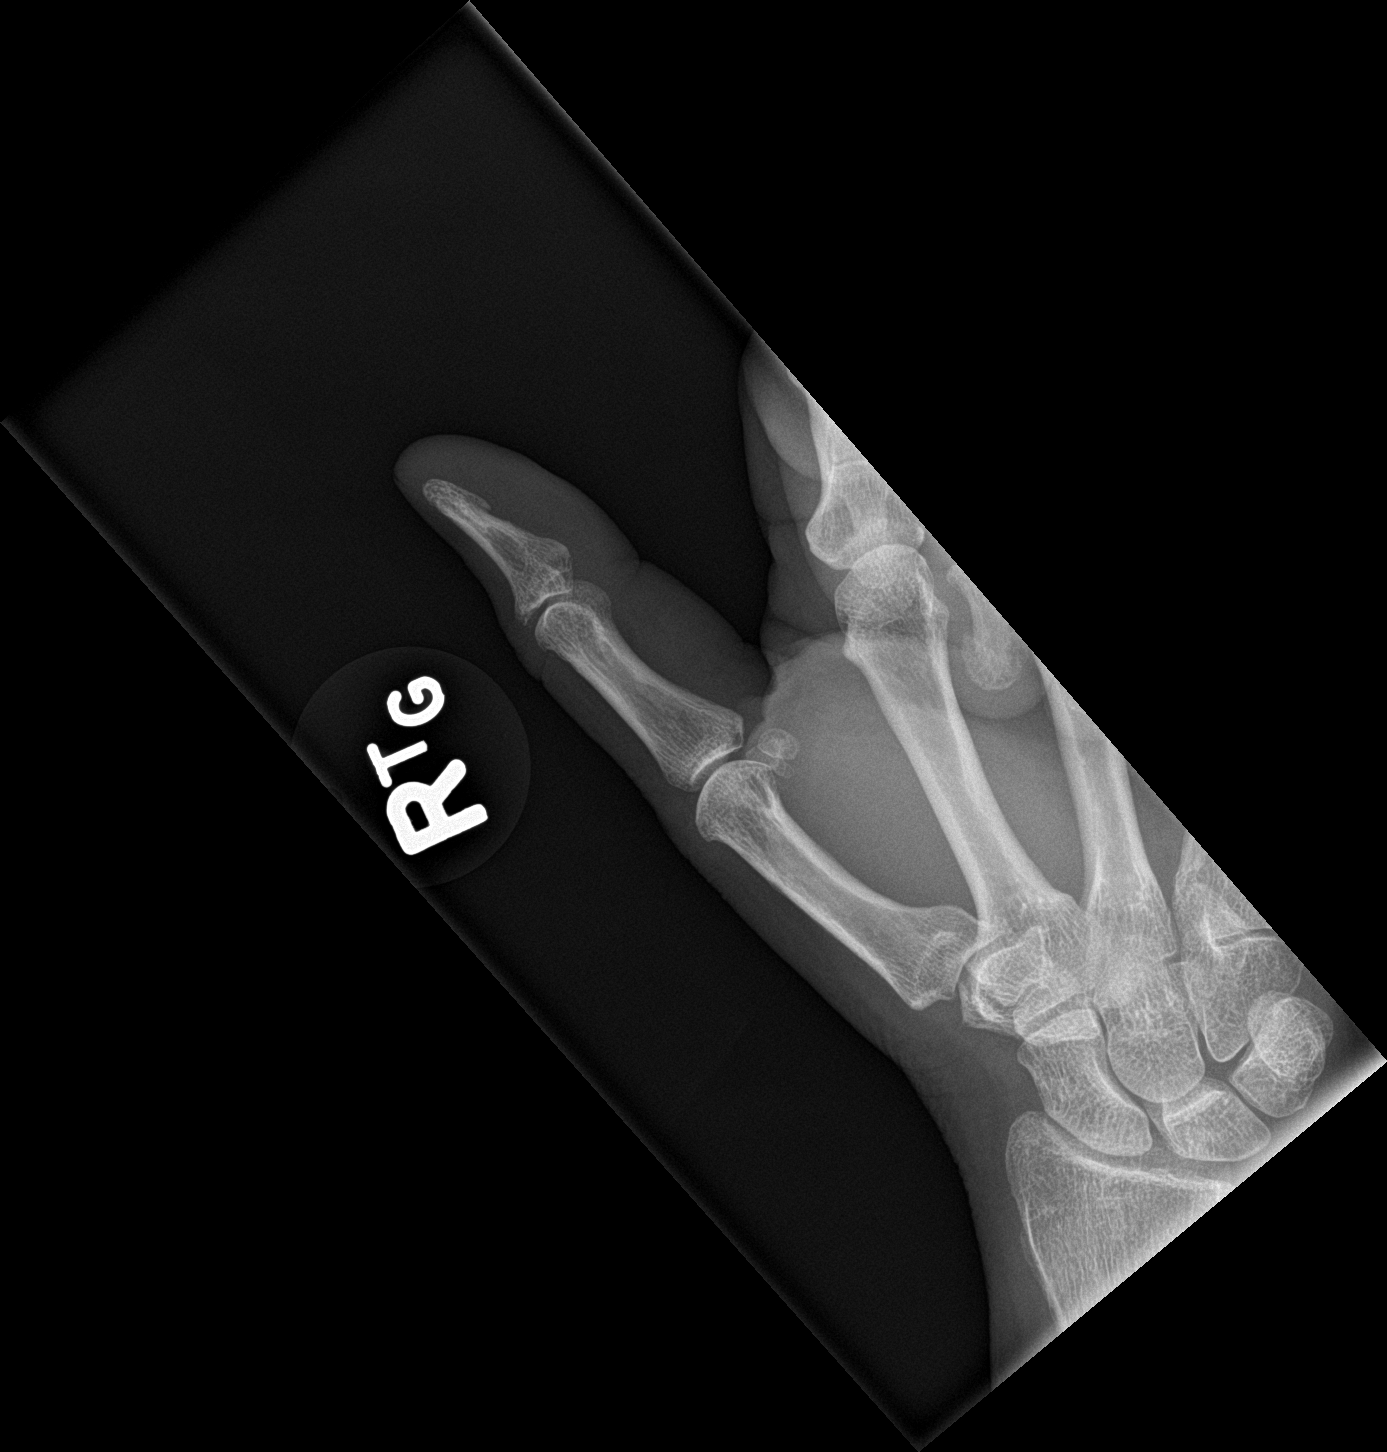

[3 of 3 positions shown; findings below may reference images not displayed]

FINDINGS: There is no evidence of fracture or dislocation. There is no
evidence of arthropathy or other focal bone abnormality. Soft
tissues are unremarkable
IMPRESSION: Normal right thumb.

## 2018-08-18 ENCOUNTER — Ambulatory Visit (INDEPENDENT_AMBULATORY_CARE_PROVIDER_SITE_OTHER): Payer: BLUE CROSS/BLUE SHIELD | Admitting: Physician Assistant

## 2018-08-18 VITALS — BP 138/76 | HR 86 | Temp 98.5°F | Wt 171.0 lb

## 2018-08-18 DIAGNOSIS — Z23 Encounter for immunization: Secondary | ICD-10-CM

## 2018-08-18 NOTE — Progress Notes (Signed)
Pt in today for shingrix vaccine. This is 2 of 2 of the shingrix series. Vitals taken and no fever noted. Vaccine was given in  Left deltoid. Pt tolerated well with no immediate complications.

## 2018-09-07 ENCOUNTER — Ambulatory Visit: Payer: BLUE CROSS/BLUE SHIELD | Admitting: Sports Medicine

## 2018-12-07 ENCOUNTER — Other Ambulatory Visit: Payer: Self-pay

## 2018-12-07 ENCOUNTER — Ambulatory Visit: Payer: BLUE CROSS/BLUE SHIELD | Admitting: Physician Assistant

## 2018-12-07 ENCOUNTER — Encounter: Payer: Self-pay | Admitting: Physician Assistant

## 2018-12-07 VITALS — BP 145/67 | HR 66 | Temp 98.3°F | Wt 168.9 lb

## 2018-12-07 DIAGNOSIS — M5030 Other cervical disc degeneration, unspecified cervical region: Secondary | ICD-10-CM

## 2018-12-07 DIAGNOSIS — J0140 Acute pansinusitis, unspecified: Secondary | ICD-10-CM | POA: Diagnosis not present

## 2018-12-07 DIAGNOSIS — R200 Anesthesia of skin: Secondary | ICD-10-CM | POA: Diagnosis not present

## 2018-12-07 DIAGNOSIS — R202 Paresthesia of skin: Secondary | ICD-10-CM

## 2018-12-07 MED ORDER — AMOXICILLIN-POT CLAVULANATE 875-125 MG PO TABS: 1.0000 | ORAL_TABLET | Freq: Two times a day (BID) | ORAL | 0 refills | Status: DC

## 2018-12-07 MED ORDER — DICLOFENAC SODIUM 1 % TD GEL: 4.0000 g | Freq: Four times a day (QID) | TRANSDERMAL | 1 refills | Status: DC

## 2018-12-07 NOTE — Patient Instructions (Signed)
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
## 2018-12-09 ENCOUNTER — Ambulatory Visit: Payer: BLUE CROSS/BLUE SHIELD | Admitting: Family Medicine

## 2018-12-09 ENCOUNTER — Other Ambulatory Visit: Payer: Self-pay

## 2018-12-09 VITALS — HR 88

## 2018-12-09 DIAGNOSIS — S81811A Laceration without foreign body, right lower leg, initial encounter: Secondary | ICD-10-CM | POA: Diagnosis not present

## 2018-12-09 NOTE — Progress Notes (Signed)
Amanda Novak is a 63 y.o. female who presents to [facility] today for laceration right leg.  Patient was in her normal state of health today when she was shaving her legs.  She managed to nick a varicose or spider vein on the right upper lateral calf.  She notes that this is been bleeding freely and she has been controlling the bleeding with compression but it starts bleeding again as soon as pressure is taken away.  This happened just a few minutes prior to arrival.  She feels well otherwise with no fevers or chills nausea vomiting or diarrhea.    She was seen on March 17 for sinusitis and given Augmentin.  She is feeling a lot better now.  She denies any significant cough or congestion.   ROS as above:  Exam:  Today's Vitals   12/09/18 1402  Pulse: 88    Wt Readings from Last 5 Encounters:  12/07/18 168 lb 14.4 oz (76.6 kg)  08/18/18 171 lb (77.6 kg)  06/17/18 171 lb (77.6 kg)  04/15/18 166 lb (75.3 kg)  03/16/18 167 lb (75.8 kg)    Gen: Well NAD Right upper lateral calf: Varicosity with freely bleeding pinpoint laceration.  Bleeding is controllable with compression but starts bleeding again as soon as compression is relieved.  Patient has intact pulses distally.  Lab and Radiology Results Laceration repair:  Consent obtained and timeout performed. Skin cleaned with alcohol and 1 mL of lidocaine without epinephrine injected around the varicosity achieving anesthesia. 1 simple interrupted suture using 3-0 Ethilon was used to compress the pinpoint bleeding.  There is still a bit of bleeding and another simple interrupted suture was used.  Following this only small oozing from the needle puncture was seen. Patient tolerated the procedure well. Antibiotic ointment and mild pressure dressing with Ace wrap applied.   Assessment and Plan: 63 y.o. female with   Right leg laceration and varicosity. Controlled with 2 simple interrupted sutures.  Dressing applied.  Discussed precautions and reviewed protocol. Return for suture removal in 1 week.  Return sooner if needed.  PDMP not reviewed this encounter. No orders of the defined types were placed in this encounter.  No orders of the defined types were placed in this encounter.    Historical information moved to improve visibility of documentation.  Past Medical History:  Diagnosis Date  . Fibromyalgia   . GERD (gastroesophageal reflux disease)   . Hyperlipidemia 10/09/2015  . Lactose intolerance   . Nipple discharge    Past Surgical History:  Procedure Laterality Date  . BREAST SURGERY    . tubal lig     Social History   Tobacco Use  . Smoking status: Never Smoker  . Smokeless tobacco: Never Used  Substance Use Topics  . Alcohol use: Not on file   family history includes Alzheimer's disease in her mother; Rheum arthritis in her mother.  Medications: Current Outpatient Medications  Medication Sig Dispense Refill  . amoxicillin-clavulanate (AUGMENTIN) 875-125 MG tablet Take 1 tablet by mouth 2 (two) times daily. 20 tablet 0  . Ascorbic Acid (VITAMIN C) 1000 MG tablet Take 1,000 mg by mouth daily.    Marland Kitchen atorvastatin (LIPITOR) 20 MG tablet TAKE ONE TABLET BY MOUTH DAILY 90 tablet 3  . cholecalciferol (VITAMIN D) 1000 units tablet Take 2,000 Units by mouth daily.    . Cyanocobalamin (B-12) 500 MCG TABS Take 500 mcg by mouth daily. Please  follow-up with [name] to recheck B12 levels 6-8 weeks after starting oral medications 90 tablet 0  . diclofenac sodium (VOLTAREN) 1 % GEL Apply 4 g topically 4 (four) times daily. To affected joint. 100 g 1  . pantoprazole (PROTONIX) 40 MG tablet Take 1 tablet (40 mg total) by mouth 2 (two) times daily before a meal. 60 tablet 3   No current facility-administered medications for this visit.    Allergies  Allergen Reactions  . Tramadol Itching  .  Codeine Hives and Itching  . Hydrocodone Hives and Itching  . Zyrtec [Cetirizine] Other (See Comments)    Headache     Discussed warning signs or symptoms. Please see discharge instructions. Patient expresses understanding.

## 2018-12-09 NOTE — Patient Instructions (Signed)
Thank you for coming in today.  Keep the wound clean and covered with ointment.  Return in 1 week for suture removal with myself or [name].   Let us know sooner if needed.    Sutured Wound Care Sutures are stitches that can be used to close wounds. Taking care of your wound properly can help to prevent pain and infection. It can also help your wound to heal more quickly. Follow instructions from your health care provider about how to care for your sutured wound. Supplies needed:  Soap and water.  A clean bandage (dressing), if needed.  Antibiotic ointment.  A clean towel. How to care for your sutured wound   Keep the wound completely dry for the first 24 hours, or for as long as directed by your health care provider. After 24-48 hours, you may shower or bathe as directed by your health care provider. Do not soak or submerge the wound in water until the sutures have been removed.  After the first 24 hours, clean the wound once a day, or as often as directed by your health care provider, using the following steps: ? Wash the wound with soap and water. ? Rinse the wound with water to remove all soap. ? Pat the wound dry with a clean towel. Do not rub the wound.  After cleaning the wound, apply a thin layer of antibiotic ointment as directed by your health care provider. This will prevent infection and keep the dressing from sticking to the wound.  Follow instructions from your health care provider about how to change your dressing: ? Wash your hands with soap and water. If soap and water are not available, use hand sanitizer. ? Change your dressing at least once a day, or as often as told by your health care provider. If your dressing gets wet or dirty, change it. ? Leave sutures and other skin closures, such as adhesive tape or skin glue, in place. These skin closures may need to stay in place for 2 weeks or longer. If adhesive strip edges start to loosen and curl up, you may trim the  loose edges. Do not remove adhesive strips completely unless your health care provider tells you to do that.  Check your wound every day for signs of infection. Watch for: ? Redness, swelling, or pain. ? Fluid or blood. ? Warmth. ? Pus or a bad smell.  Have the sutures removed as directed by your health care provider. Follow these instructions at home: Medicines  Take or apply over-the-counter and prescription medicines only as told by your health care provider.  If you were prescribed an antibiotic medicine or ointment, take or apply it as told by your health care provider. Do not stop using the antibiotic even if your condition improves. General instructions  To help reduce scarring after your wound heals, cover your wound with clothing or apply sunscreen of at least 30 SPF whenever you are outside.  Do not scratch or pick at your wound.  Avoid stretching your wound.  Raise (elevate) the injured area above the level of your heart while you are sitting or lying down, if possible.  Drink enough fluids to keep your urine clear or pale yellow.  Keep all follow-up visits as told by your health care provider. This is important. Contact a health care provider if:  You received a tetanus shot and you have swelling, severe pain, redness, or bleeding at the injection site.  Your wound breaks open.  You have  redness, swelling, or pain around your wound.  You have fluid or blood coming from your wound.  Your wound feels warm to the touch.  You have a fever.  You notice something coming out of your wound, such as wood or glass.  You have pain that does not get better with medicine.  The skin near your wound changes color.  You need to change your dressing very frequently due to a lot of fluid, blood, or pus draining from the wound.  You develop a new rash.  You develop numbness around the wound. Get help right away if:  You develop severe swelling around your wound.  You  have pus or a bad smell coming from your wound.  Your pain suddenly gets worse and is severe.  You develop painful lumps near your wound or anywhere on your body.  You have a red streak going away from your wound.  The wound is on your hand or foot and: ? You cannot properly move a finger or toe. ? Your fingers or toes look pale or bluish. ? You have numbness that is spreading down your hand, foot, fingers, or toes. Summary  Sutures are stitches that can be used to close wounds.  Taking care of your wound properly can help to prevent pain and infection.  Keep the wound completely dry for the first 24 hours, or for as long as directed by your health care provider. After 24-48 hours, you may shower or bathe as directed by your health care provider. This information is not intended to replace advice given to you by your health care provider. Make sure you discuss any questions you have with your health care provider. Document Released: 10/16/2004 Document Revised: 10/14/2016 Document Reviewed: 10/14/2016 Elsevier Interactive Patient Education  2019 [employer].

## 2018-12-23 ENCOUNTER — Encounter: Payer: Self-pay | Admitting: Family Medicine

## 2018-12-23 ENCOUNTER — Ambulatory Visit (INDEPENDENT_AMBULATORY_CARE_PROVIDER_SITE_OTHER): Payer: BLUE CROSS/BLUE SHIELD | Admitting: Family Medicine

## 2018-12-23 ENCOUNTER — Ambulatory Visit: Payer: BLUE CROSS/BLUE SHIELD | Admitting: Family Medicine

## 2018-12-23 ENCOUNTER — Other Ambulatory Visit: Payer: Self-pay

## 2018-12-23 VITALS — BP 142/47 | HR 63 | Ht 64.0 in | Wt 169.0 lb

## 2018-12-23 DIAGNOSIS — S81811A Laceration without foreign body, right lower leg, initial encounter: Secondary | ICD-10-CM | POA: Diagnosis not present

## 2018-12-23 NOTE — Patient Instructions (Signed)
Thank you for coming in today. Recheck as needed.    

## 2018-12-23 NOTE — Progress Notes (Signed)
   Amanda Novak is a 63 y.o. female who presents to [facility] today for follow-up laceration leg.  Deshunda seen on March 19 for duration of the right leg.  This was repaired with 2 simple interrupted sutures.  She is here today for suture removal.  She is doing well with no issues.    ROS:  As above  Exam:  BP (!) 142/47   Pulse 63   Ht 5\' 4"  (1.626 m)   Wt 169 lb (76.7 kg)   BMI 29.01 kg/m  Wt Readings from Last 5 Encounters:  12/23/18 169 lb (76.7 kg)  12/07/18 168 lb 14.4 oz (76.6 kg)  08/18/18 171 lb (77.6 kg)  06/17/18 171 lb (77.6 kg)  04/15/18 166 lb (75.3 kg)   General: Well Developed, well nourished, and in no acute distress.  Neuro/Psych: Alert and oriented x3, extra-ocular muscles intact, able to move all 4 extremities, sensation grossly intact. Skin: Warm and dry, no rashes noted.  Well-appearing wound with 2 simple interrupted sutures right lateral proximal calf.  No erythema or discharge. Respiratory: Not using accessory muscles, speaking in full sentences, trachea midline.  Cardiovascular: Pulses palpable, no extremity edema. Abdomen: Does not appear distended.    2 simple interrupted sutures removed    Assessment and Plan: 63 y.o. female with right leg laceration.  Doing quite well.  Sutures removed.  Watchful waiting recheck as needed.  Wound care instructions discussed.  I spent 10 minutes with this patient, greater than 50% was face-to-face time counseling regarding wound care and plan.  Historical information moved to improve visibility of documentation.  Past Medical History:  Diagnosis Date  . Fibromyalgia   . GERD (gastroesophageal reflux disease)   . Hyperlipidemia 10/09/2015  . Lactose intolerance   . Nipple discharge    Past Surgical History:  Procedure Laterality Date  . BREAST SURGERY    . tubal lig     Social History   Tobacco Use  . Smoking status: Never Smoker  . Smokeless  tobacco: Never Used  Substance Use Topics  . Alcohol use: Not on file   family history includes Alzheimer's disease in her mother; Rheum arthritis in her mother.  Medications: Current Outpatient Medications  Medication Sig Dispense Refill  . amoxicillin-clavulanate (AUGMENTIN) 875-125 MG tablet Take 1 tablet by mouth 2 (two) times daily. 20 tablet 0  . Ascorbic Acid (VITAMIN C) 1000 MG tablet Take 1,000 mg by mouth daily.    Marland Kitchen atorvastatin (LIPITOR) 20 MG tablet TAKE ONE TABLET BY MOUTH DAILY 90 tablet 3  . cholecalciferol (VITAMIN D) 1000 units tablet Take 2,000 Units by mouth daily.    . Cyanocobalamin (B-12) 500 MCG TABS Take 500 mcg by mouth daily. Please follow-up with [name] to recheck B12 levels 6-8 weeks after starting oral medications 90 tablet 0  . diclofenac sodium (VOLTAREN) 1 % GEL Apply 4 g topically 4 (four) times daily. To affected joint. 100 g 1  . pantoprazole (PROTONIX) 40 MG tablet Take 1 tablet (40 mg total) by mouth 2 (two) times daily before a meal. 60 tablet 3   No current facility-administered medications for this visit.    Allergies  Allergen Reactions  . Tramadol Itching  . Codeine Hives and Itching  . Hydrocodone Hives and Itching  . Zyrtec [Cetirizine] Other (See Comments)    Headache      Discussed warning signs or symptoms. Please see discharge instructions. Patient expresses understanding.

## 2019-03-07 DIAGNOSIS — G5602 Carpal tunnel syndrome, left upper limb: Secondary | ICD-10-CM | POA: Diagnosis not present

## 2019-03-07 DIAGNOSIS — M25832 Other specified joint disorders, left wrist: Secondary | ICD-10-CM | POA: Diagnosis not present

## 2019-04-11 DIAGNOSIS — D1809 Hemangioma of other sites: Secondary | ICD-10-CM | POA: Diagnosis not present

## 2019-04-11 DIAGNOSIS — M24832 Other specific joint derangements of left wrist, not elsewhere classified: Secondary | ICD-10-CM | POA: Diagnosis not present

## 2019-04-11 DIAGNOSIS — G5602 Carpal tunnel syndrome, left upper limb: Secondary | ICD-10-CM | POA: Diagnosis not present

## 2019-05-10 ENCOUNTER — Other Ambulatory Visit: Payer: Self-pay | Admitting: Sports Medicine

## 2019-05-10 DIAGNOSIS — K219 Gastro-esophageal reflux disease without esophagitis: Secondary | ICD-10-CM

## 2019-05-10 NOTE — Telephone Encounter (Signed)
To PCP

## 2019-05-23 ENCOUNTER — Other Ambulatory Visit: Payer: Self-pay | Admitting: Physician Assistant

## 2019-05-24 ENCOUNTER — Other Ambulatory Visit: Payer: Self-pay | Admitting: Physician Assistant

## 2019-07-18 ENCOUNTER — Ambulatory Visit: Payer: BC Managed Care – PPO | Admitting: Physician Assistant

## 2019-07-18 ENCOUNTER — Other Ambulatory Visit: Payer: Self-pay

## 2019-07-18 VITALS — BP 135/69 | HR 63 | Temp 98.4°F | Ht 64.0 in | Wt 164.0 lb

## 2019-07-18 DIAGNOSIS — Z23 Encounter for immunization: Secondary | ICD-10-CM

## 2019-07-18 DIAGNOSIS — T300 Burn of unspecified body region, unspecified degree: Secondary | ICD-10-CM | POA: Diagnosis not present

## 2019-07-18 DIAGNOSIS — R109 Unspecified abdominal pain: Secondary | ICD-10-CM

## 2019-07-18 DIAGNOSIS — R3915 Urgency of urination: Secondary | ICD-10-CM

## 2019-07-18 DIAGNOSIS — H1032 Unspecified acute conjunctivitis, left eye: Secondary | ICD-10-CM

## 2019-07-18 LAB — POCT URINALYSIS DIPSTICK
Bilirubin, UA: NEGATIVE
Glucose, UA: NEGATIVE
Ketones, UA: NEGATIVE
Leukocytes, UA: NEGATIVE
Nitrite, UA: NEGATIVE
Protein, UA: NEGATIVE
Spec Grav, UA: <=1.005 — AB (ref 1.010–1.025)
Urobilinogen, UA: 0.2 E.U./dL
pH, UA: 6 (ref 5.0–8.0)

## 2019-07-18 MED ORDER — SILVER SULFADIAZINE 1 % EX CREA: 1.0000 "application " | TOPICAL_CREAM | Freq: Every day | CUTANEOUS | 0 refills | Status: DC

## 2019-07-18 MED ORDER — PHENAZOPYRIDINE HCL 200 MG PO TABS: 200.0000 mg | ORAL_TABLET | Freq: Three times a day (TID) | ORAL | 0 refills | Status: AC

## 2019-07-18 MED ORDER — POLYMYXIN B-TRIMETHOPRIM 10000-0.1 UNIT/ML-% OP SOLN: 1.0000 [drp] | Freq: Four times a day (QID) | OPHTHALMIC | 0 refills | Status: DC

## 2019-07-18 NOTE — Progress Notes (Signed)
Please culture and add microscopic
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
## 2019-07-18 NOTE — Patient Instructions (Addendum)
Send eye drops to use for 7 days.  Send bladder anti-inflammatory for 2 days.     Burn Care, Adult A burn is an injury to the skin or the tissues under the skin. There are three types of burns:  First degree. These burns may cause the skin to be red and a bit swollen.  Second degree. These burns are very painful and cause the skin to be very red. The skin may also leak fluid, look shiny, and start to have blisters.  Third degree. These burns cause permanent damage. They turn the skin white or black and make it look charred, dry, and leathery. Taking care of your burn properly can help to prevent pain and infection. It can also help the burn to heal more quickly. How is this treated? Right after a burn:  Rinse or soak the burn under cool water. Do this for several minutes. Do not put ice on your burn. That can cause more damage.  Lightly cover the burn with a clean (sterile) cloth (dressing). Burn care  Raise (elevate) the injured area above the level of your heart while sitting or lying down.  Follow instructions from your doctor about: ? How to clean and take care of the burn. ? When to change and remove the cloth.  Check your burn every day for signs of infection. Check for: ? More redness, swelling, or pain. ? Warmth. ? Pus or a bad smell. Medicine   Take over-the-counter and prescription medicines only as told by your doctor.  If you were prescribed antibiotic medicine, take or apply it as told by your doctor. Do not stop using the antibiotic even if your condition improves. General instructions  To prevent infection: ? Do not put butter, oil, or other home treatments on the burn. ? Do not scratch or pick at the burn. ? Do not break any blisters. ? Do not peel skin.  Do not rub your burn, even when you are cleaning it.  Protect your burn from the sun. Contact a doctor if:  Your condition does not get better.  Your condition gets worse.  You have a  fever.  Your burn looks different or starts to have black or red spots on it.  Your burn feels warm to the touch.  Your pain is not controlled with medicine. Get help right away if:  You have redness, swelling, or pain at the site of the burn.  You have fluid, blood, or pus coming from your burn.  You have red streaks near the burn.  You have very bad pain. This information is not intended to replace advice given to you by your health care provider. Make sure you discuss any questions you have with your health care provider. Document Released: 06/17/2008 Document Revised: 12/29/2018 Document Reviewed: 02/26/2016 Elsevier Patient Education  2020 [employer].

## 2019-07-19 DIAGNOSIS — R3915 Urgency of urination: Secondary | ICD-10-CM | POA: Insufficient documentation

## 2019-07-19 DIAGNOSIS — T300 Burn of unspecified body region, unspecified degree: Secondary | ICD-10-CM | POA: Insufficient documentation

## 2019-07-19 DIAGNOSIS — R109 Unspecified abdominal pain: Secondary | ICD-10-CM | POA: Insufficient documentation

## 2019-07-19 DIAGNOSIS — H1032 Unspecified acute conjunctivitis, left eye: Secondary | ICD-10-CM | POA: Insufficient documentation

## 2019-07-19 NOTE — Progress Notes (Signed)
No blood, bacteria or any abnormality of the urine. Next step would be to do an pelvic ultrasound are you ok with me ordering?

## 2019-07-20 ENCOUNTER — Other Ambulatory Visit: Payer: Self-pay | Admitting: Physician Assistant

## 2019-07-20 LAB — URINALYSIS, MICROSCOPIC ONLY
Bacteria, UA: NONE SEEN /HPF
Hyaline Cast: NONE SEEN /LPF
RBC / HPF: NONE SEEN /HPF (ref 0–2)
Squamous Epithelial / HPF: NONE SEEN /HPF (ref <=5)
WBC, UA: NONE SEEN /HPF (ref 0–5)

## 2019-07-20 LAB — URINE CULTURE
MICRO NUMBER:: 1029895
Result:: NO GROWTH
SPECIMEN QUALITY:: ADEQUATE

## 2019-09-06 DIAGNOSIS — K760 Fatty (change of) liver, not elsewhere classified: Secondary | ICD-10-CM | POA: Diagnosis not present

## 2019-09-06 DIAGNOSIS — R1013 Epigastric pain: Secondary | ICD-10-CM | POA: Diagnosis not present

## 2019-09-06 DIAGNOSIS — R110 Nausea: Secondary | ICD-10-CM | POA: Diagnosis not present

## 2019-09-06 DIAGNOSIS — D649 Anemia, unspecified: Secondary | ICD-10-CM | POA: Diagnosis not present

## 2019-09-06 DIAGNOSIS — D509 Iron deficiency anemia, unspecified: Secondary | ICD-10-CM | POA: Diagnosis not present

## 2019-09-06 DIAGNOSIS — K8020 Calculus of gallbladder without cholecystitis without obstruction: Secondary | ICD-10-CM | POA: Diagnosis not present

## 2019-09-15 DIAGNOSIS — R109 Unspecified abdominal pain: Secondary | ICD-10-CM | POA: Diagnosis not present

## 2019-09-15 DIAGNOSIS — R634 Abnormal weight loss: Secondary | ICD-10-CM | POA: Diagnosis not present

## 2019-09-15 DIAGNOSIS — N281 Cyst of kidney, acquired: Secondary | ICD-10-CM | POA: Diagnosis not present

## 2019-09-19 ENCOUNTER — Other Ambulatory Visit: Payer: Self-pay | Admitting: Physician Assistant

## 2019-09-21 ENCOUNTER — Other Ambulatory Visit: Payer: Self-pay

## 2019-09-21 ENCOUNTER — Ambulatory Visit: Payer: BC Managed Care – PPO | Admitting: Physician Assistant

## 2019-09-21 VITALS — BP 136/55 | HR 62 | Ht 64.0 in | Wt 163.0 lb

## 2019-09-21 DIAGNOSIS — R7989 Other specified abnormal findings of blood chemistry: Secondary | ICD-10-CM

## 2019-09-21 DIAGNOSIS — K7581 Nonalcoholic steatohepatitis (NASH): Secondary | ICD-10-CM | POA: Diagnosis not present

## 2019-09-21 DIAGNOSIS — R1030 Lower abdominal pain, unspecified: Secondary | ICD-10-CM

## 2019-09-21 DIAGNOSIS — E782 Mixed hyperlipidemia: Secondary | ICD-10-CM

## 2019-09-21 DIAGNOSIS — R7309 Other abnormal glucose: Secondary | ICD-10-CM | POA: Diagnosis not present

## 2019-09-21 DIAGNOSIS — R350 Frequency of micturition: Secondary | ICD-10-CM | POA: Diagnosis not present

## 2019-09-21 LAB — POCT URINALYSIS DIP (CLINITEK)
Bilirubin, UA: NEGATIVE
Blood, UA: NEGATIVE
Glucose, UA: NEGATIVE mg/dL
Ketones, POC UA: NEGATIVE mg/dL
Leukocytes, UA: NEGATIVE
Nitrite, UA: NEGATIVE
POC PROTEIN,UA: NEGATIVE
Spec Grav, UA: 1.015 (ref 1.010–1.025)
Urobilinogen, UA: 0.2 E.U./dL
pH, UA: 7 (ref 5.0–8.0)

## 2019-09-21 LAB — POCT GLYCOSYLATED HEMOGLOBIN (HGB A1C): Hemoglobin A1C: 5.5 % (ref 4.0–5.6)
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
## 2019-09-22 ENCOUNTER — Other Ambulatory Visit: Payer: Self-pay | Admitting: Neurology

## 2019-09-22 LAB — LIPID PANEL W/REFLEX DIRECT LDL
Cholesterol: 197 mg/dL (ref <200)
HDL: 44 mg/dL — ABNORMAL LOW (ref >=50)
LDL Cholesterol (Calc): 124 mg/dL (calc) — ABNORMAL HIGH
Non-HDL Cholesterol (Calc): 153 mg/dL (calc) — ABNORMAL HIGH (ref <130)
Total CHOL/HDL Ratio: 4.5 (calc) (ref <5.0)
Triglycerides: 174 mg/dL — ABNORMAL HIGH (ref <150)

## 2019-09-22 LAB — COMPLETE METABOLIC PANEL WITH GFR
AG Ratio: 1.8 (calc) (ref 1.0–2.5)
ALT: 27 U/L (ref 6–29)
AST: 22 U/L (ref 10–35)
Albumin: 4.5 g/dL (ref 3.6–5.1)
Alkaline phosphatase (APISO): 184 U/L — ABNORMAL HIGH (ref 37–153)
BUN: 13 mg/dL (ref 7–25)
CO2: 28 mmol/L (ref 20–32)
Calcium: 9.9 mg/dL (ref 8.6–10.4)
Chloride: 105 mmol/L (ref 98–110)
Creat: 0.81 mg/dL (ref 0.50–0.99)
GFR, Est African American: 90 mL/min/{1.73_m2} (ref >=60)
GFR, Est Non African American: 78 mL/min/{1.73_m2} (ref >=60)
Globulin: 2.5 g/dL (calc) (ref 1.9–3.7)
Glucose, Bld: 89 mg/dL (ref 65–99)
Potassium: 4.3 mmol/L (ref 3.5–5.3)
Sodium: 143 mmol/L (ref 135–146)
Total Bilirubin: 0.7 mg/dL (ref 0.2–1.2)
Total Protein: 7 g/dL (ref 6.1–8.1)

## 2019-09-22 LAB — MAGNESIUM: Magnesium: 2.1 mg/dL (ref 1.5–2.5)

## 2019-09-22 MED ORDER — ATORVASTATIN CALCIUM 40 MG PO TABS: 40.0000 mg | ORAL_TABLET | Freq: Every day | ORAL | 3 refills | Status: DC

## 2019-09-22 NOTE — Progress Notes (Signed)
Call pt:  Magnesium, potassium, sodium normal. Glucose looks great. Kidney looks great. Liver enyzme is stable. LDL still not quite to goal. Are you taking lipitor? If so I would like to increase to 33m. If not taking need to start.   -Luvenia Starch

## 2019-09-26 ENCOUNTER — Encounter: Payer: Self-pay | Admitting: Physician Assistant

## 2019-09-26 DIAGNOSIS — R1030 Lower abdominal pain, unspecified: Secondary | ICD-10-CM | POA: Insufficient documentation

## 2019-09-26 DIAGNOSIS — R350 Frequency of micturition: Secondary | ICD-10-CM | POA: Insufficient documentation

## 2019-09-26 DIAGNOSIS — R7989 Other specified abnormal findings of blood chemistry: Secondary | ICD-10-CM | POA: Insufficient documentation

## 2019-10-03 ENCOUNTER — Telehealth: Payer: Self-pay | Admitting: Physician Assistant

## 2019-10-03 NOTE — Telephone Encounter (Signed)
Ultrasound was order [place] of [place]. I have not heard or seen anything?

## 2019-10-03 NOTE — Telephone Encounter (Signed)
I faxed this order where she wanted to go on 12/31 - [facility]

## 2019-10-03 NOTE — Telephone Encounter (Signed)
Ok thanks perfect.

## 2019-10-03 NOTE — Telephone Encounter (Signed)
Jenny Reichmann,   Did you send this? This is the Ultrasound where she wanted in [place]. I had given you a signed order. Thanks.

## 2019-10-03 NOTE — Telephone Encounter (Signed)
Spoke with patient and she states she is waiting on Ultrasound. She is feeling better and needs to follow up with OBGYN anyway. She was going to wait and speak with them first. [place].

## 2019-10-20 ENCOUNTER — Other Ambulatory Visit: Payer: Self-pay | Admitting: Physician Assistant

## 2019-10-20 DIAGNOSIS — D649 Anemia, unspecified: Secondary | ICD-10-CM | POA: Diagnosis not present

## 2019-10-20 DIAGNOSIS — K5900 Constipation, unspecified: Secondary | ICD-10-CM | POA: Diagnosis not present

## 2019-10-20 DIAGNOSIS — R1013 Epigastric pain: Secondary | ICD-10-CM | POA: Diagnosis not present

## 2019-10-20 DIAGNOSIS — K760 Fatty (change of) liver, not elsewhere classified: Secondary | ICD-10-CM | POA: Diagnosis not present

## 2019-10-24 DIAGNOSIS — M25832 Other specified joint disorders, left wrist: Secondary | ICD-10-CM | POA: Diagnosis not present

## 2019-10-24 DIAGNOSIS — G5602 Carpal tunnel syndrome, left upper limb: Secondary | ICD-10-CM | POA: Diagnosis not present

## 2019-11-07 ENCOUNTER — Encounter: Payer: Self-pay | Admitting: Sports Medicine

## 2019-11-07 ENCOUNTER — Ambulatory Visit (INDEPENDENT_AMBULATORY_CARE_PROVIDER_SITE_OTHER): Payer: BC Managed Care – PPO | Admitting: Sports Medicine

## 2019-11-07 ENCOUNTER — Other Ambulatory Visit: Payer: Self-pay

## 2019-11-07 ENCOUNTER — Ambulatory Visit (INDEPENDENT_AMBULATORY_CARE_PROVIDER_SITE_OTHER): Payer: BC Managed Care – PPO

## 2019-11-07 DIAGNOSIS — M1611 Unilateral primary osteoarthritis, right hip: Secondary | ICD-10-CM | POA: Diagnosis not present

## 2019-11-07 DIAGNOSIS — M7061 Trochanteric bursitis, right hip: Secondary | ICD-10-CM

## 2019-11-07 NOTE — Progress Notes (Signed)
    Procedures performed today:    None.  Independent interpretation of tests performed by another provider:   None.  Impression and Recommendations:    Trochanteric bursitis, right hip Several weeks of pain over the right greater trochanter, worse when walking, we are going to start conservatively with rehabilitation exercises, right hip x-rays. Return to see me in 2 weeks, injection if no better.    ___________________________________________ Gwen Her. Dianah Field, M.D., ABFM., CAQSM. Primary Care and [place] Instructor of [place] of [facility]

## 2019-11-07 NOTE — Assessment & Plan Note (Signed)
Several weeks of pain over the right greater trochanter, worse when walking, we are going to start conservatively with rehabilitation exercises, right hip x-rays. Return to see me in 2 weeks, injection if no better.

## 2019-11-18 ENCOUNTER — Other Ambulatory Visit: Payer: Self-pay | Admitting: Physician Assistant

## 2019-11-21 ENCOUNTER — Ambulatory Visit: Payer: BC Managed Care – PPO | Admitting: Sports Medicine

## 2019-11-24 ENCOUNTER — Other Ambulatory Visit: Payer: Self-pay

## 2019-11-24 ENCOUNTER — Ambulatory Visit (INDEPENDENT_AMBULATORY_CARE_PROVIDER_SITE_OTHER): Payer: BC Managed Care – PPO | Admitting: Family Medicine

## 2019-11-24 ENCOUNTER — Encounter: Payer: Self-pay | Admitting: Family Medicine

## 2019-11-24 ENCOUNTER — Other Ambulatory Visit: Payer: Self-pay | Admitting: Family Medicine

## 2019-11-24 DIAGNOSIS — R1032 Left lower quadrant pain: Secondary | ICD-10-CM | POA: Diagnosis not present

## 2019-11-24 LAB — BASIC METABOLIC PANEL WITH GFR
BUN: 16 mg/dL (ref 7–25)
CO2: 30 mmol/L (ref 20–32)
Calcium: 9.4 mg/dL (ref 8.6–10.4)
Chloride: 106 mmol/L (ref 98–110)
Creat: 0.74 mg/dL (ref 0.50–0.99)
GFR, Est African American: 101 mL/min/{1.73_m2} (ref >=60)
GFR, Est Non African American: 87 mL/min/{1.73_m2} (ref >=60)
Glucose, Bld: 85 mg/dL (ref 65–99)
Potassium: 4.2 mmol/L (ref 3.5–5.3)
Sodium: 142 mmol/L (ref 135–146)

## 2019-11-24 LAB — CBC WITH DIFFERENTIAL/PLATELET
Absolute Monocytes: 721 cells/uL (ref 200–950)
Basophils Absolute: 27 cells/uL (ref 0–200)
Basophils Relative: 0.3 %
Eosinophils Absolute: 45 cells/uL (ref 15–500)
Eosinophils Relative: 0.5 %
HCT: 38.8 % (ref 35.0–45.0)
Hemoglobin: 12.9 g/dL (ref 11.7–15.5)
Lymphs Abs: 3480 cells/uL (ref 850–3900)
MCH: 30.5 pg (ref 27.0–33.0)
MCHC: 33.2 g/dL (ref 32.0–36.0)
MCV: 91.7 fL (ref 80.0–100.0)
MPV: 9.9 fL (ref 7.5–12.5)
Monocytes Relative: 8.1 %
Neutro Abs: 4628 cells/uL (ref 1500–7800)
Neutrophils Relative %: 52 %
Platelets: 246 10*3/uL (ref 140–400)
RBC: 4.23 10*6/uL (ref 3.80–5.10)
RDW: 12.7 % (ref 11.0–15.0)
Total Lymphocyte: 39.1 %
WBC: 8.9 10*3/uL (ref 3.8–10.8)

## 2019-11-24 MED ORDER — SULFAMETHOXAZOLE-TRIMETHOPRIM 800-160 MG PO TABS: 1.0000 | ORAL_TABLET | Freq: Two times a day (BID) | ORAL | 0 refills | Status: DC

## 2019-11-24 MED ORDER — METRONIDAZOLE 500 MG PO TABS: 500.0000 mg | ORAL_TABLET | Freq: Two times a day (BID) | ORAL | 0 refills | Status: DC

## 2019-11-24 NOTE — Progress Notes (Signed)
Pt reports hx of diverticulitis. She stated that she has been experiencing lower abdominal pain/cramping x 2 days. She rates her pain today 4/10. She has been lying around to keep the pressure off of her abdomen. She took miralax last night and had a small BM that was hard and pasty.   Denies any f/s/c/n/v/d

## 2019-11-24 NOTE — Progress Notes (Signed)
Acute Office Visit  Subjective:    Patient ID: Amanda Novak, female    DOB: 20-Jun-1956, 64 y.o.   MRN: [id number]  Chief Complaint  Patient presents with  . diverticulitius    HPI Patient is in today for Pt reports hx of diverticulitis. She stated that she has been experiencing lower abdominal pain/cramping x 2 days. She rates her pain today 4/10. She has been lying around to keep the pressure off of her abdomen. She took miralax last night and had a small BM that was hard and pasty.  Last time she had diverticulitis was a little over 5 years ago.  She says her very first episode was back in 2009 and actually had 2 episodes that year.  If this is diverticulitis it will be her fifth episode.  She says it feels like a crampy type pain in the lower abdomen.  Much more intense in the left lower quadrant compared to the right lower quadrant.  She did try some taking some Midol earlier and that did not help.  She denies any fevers, chills, or sweats.  She has not tried any other medications for pain or discomfort.  She had a normal abdominal CT in December 2020.  She also had a normal CT of the abdomen and pelvis in April 2015.  Past Medical History:  Diagnosis Date  . Fibromyalgia   . GERD (gastroesophageal reflux disease)   . Hyperlipidemia 10/09/2015  . Lactose intolerance   . Nipple discharge     Past Surgical History:  Procedure Laterality Date  . BREAST SURGERY    . tubal lig      Family History  Problem Relation Age of Onset  . Alzheimer's disease Mother   . Rheum arthritis Mother     Social History   Socioeconomic History  . Marital status: Married    Spouse name: Not on file  . Number of children: Not on file  . Years of education: Not on file  . Highest education level: Not on file  Occupational History  . Not on file  Tobacco Use  . Smoking status: Never Smoker  . Smokeless tobacco: Never Used  Substance and Sexual Activity  . Alcohol use: Not on file  .  Drug use: Not on file  . Sexual activity: Not on file  Other Topics Concern  . Not on file  Social History Narrative  . Not on file   Social Determinants of Health   Financial Resource Strain:   . Difficulty of Paying Living Expenses: Not on file  Food Insecurity:   . Worried About Charity fundraiser in the Last Year: Not on file  . Ran Out of Food in the Last Year: Not on file  Transportation Needs:   . Lack of Transportation (Medical): Not on file  . Lack of Transportation (Non-Medical): Not on file  Physical Activity:   . Days of Exercise per Week: Not on file  . Minutes of Exercise per Session: Not on file  Stress:   . Feeling of Stress : Not on file  Social Connections:   . Frequency of Communication with Friends and Family: Not on file  . Frequency of Social Gatherings with Friends and Family: Not on file  . Attends Religious Services: Not on file  . Active Member of Clubs or Organizations: Not on file  . Attends Archivist Meetings: Not on file  . Marital Status: Not on file  Intimate Partner Violence:   .  Fear of Current or Ex-Partner: Not on file  . Emotionally Abused: Not on file  . Physically Abused: Not on file  . Sexually Abused: Not on file    Outpatient Medications Prior to Visit  Medication Sig Dispense Refill  . Ascorbic Acid (VITAMIN C) 1000 MG tablet Take 1,000 mg by mouth daily.    Marland Kitchen atorvastatin (LIPITOR) 20 MG tablet TAKE ONE TABLET (20MG TOTAL) BY MOUTH DAILY. *NEED APPOINTMENT/LABS* 30 tablet 0  . BIOTIN PO Take 1 tablet by mouth daily.    . cholecalciferol (VITAMIN D) 1000 units tablet Take 2,000 Units by mouth daily.    . Cyanocobalamin (B-12) 500 MCG TABS Take 500 mcg by mouth daily. Please follow-up with [name] to recheck B12 levels 6-8 weeks after starting oral medications 90 tablet 0  . pantoprazole (PROTONIX) 40 MG tablet TAKE 1 TABLET(40 MG) BY MOUTH TWICE DAILY BEFORE A MEAL 60 tablet 3  . atorvastatin (LIPITOR) 40 MG tablet Take 1  tablet (40 mg total) by mouth daily. 90 tablet 3   No facility-administered medications prior to visit.    Allergies  Allergen Reactions  . Tramadol Itching  . Codeine Hives and Itching  . Hydrocodone Hives and Itching  . Zyrtec [Cetirizine] Other (See Comments)    Headache    Review of Systems     Objective:    Physical Exam Constitutional:      Appearance: She is well-developed.  HENT:     Head: Normocephalic and atraumatic.  Cardiovascular:     Rate and Rhythm: Normal rate and regular rhythm.     Heart sounds: Normal heart sounds.  Pulmonary:     Effort: Pulmonary effort is normal.     Breath sounds: Normal breath sounds.  Abdominal:     General: Abdomen is flat. Bowel sounds are normal. There is no distension.     Palpations: Abdomen is soft.     Tenderness: There is abdominal tenderness. There is guarding. There is no rebound.     Comments: Some tenderness and guarding in the left lower quadrant.  No rebound dryness.  Skin:    General: Skin is warm and dry.  Neurological:     Mental Status: She is alert and oriented to person, place, and time.  Psychiatric:        Behavior: Behavior normal.     BP 127/62   Pulse 72   Ht 5' 4"  (1.626 m)   Wt 161 lb (73 kg)   SpO2 97%   BMI 27.64 kg/m  Wt Readings from Last 3 Encounters:  11/24/19 161 lb (73 kg)  11/07/19 164 lb (74.4 kg)  09/21/19 163 lb (73.9 kg)    Health Maintenance Due  Topic Date Due  . HIV Screening  06/26/1972  . PAP SMEAR-Modifier  06/07/2017    There are no preventive care reminders to display for this patient.   Lab Results  Component Value Date   TSH 2.070 02/03/2018   Lab Results  Component Value Date   WBC 6.1 12/27/2015   HGB 13.0 12/27/2015   HCT 39.4 12/27/2015   MCV 88.5 12/27/2015   PLT 211 12/27/2015   Lab Results  Component Value Date   NA 143 09/21/2019   K 4.3 09/21/2019   CO2 28 09/21/2019   GLUCOSE 89 09/21/2019   BUN 13 09/21/2019   CREATININE 0.81  09/21/2019   BILITOT 0.7 09/21/2019   ALKPHOS 189 (H) 02/03/2018   AST 22 09/21/2019   ALT 27 09/21/2019  PROT 7.0 09/21/2019   ALBUMIN 4.3 02/03/2018   CALCIUM 9.9 09/21/2019   Lab Results  Component Value Date   CHOL 197 09/21/2019   Lab Results  Component Value Date   HDL 44 (L) 09/21/2019   Lab Results  Component Value Date   LDLCALC 124 (H) 09/21/2019   Lab Results  Component Value Date   TRIG 174 (H) 09/21/2019   Lab Results  Component Value Date   CHOLHDL 4.5 09/21/2019   Lab Results  Component Value Date   HGBA1C 5.5 09/21/2019       Assessment & Plan:   Problem List Items Addressed This Visit    None    Visit Diagnoses    Left lower quadrant abdominal pain       Relevant Orders   CT Abdomen Pelvis W Contrast   CBC with Differential/Platelet   BASIC METABOLIC PANEL WITH GFR     Left lower quadrant pain-suspicious for diverticulitis.  We will go ahead and get CAT scan scheduled they are unable to do it today but will schedule it for 9 AM.  Umbilical and send over Cipro and Bactrim as per current guidelines for treatment.  If at any point she feels like she is getting worse or develops a fever or has uncontrollable pain please go to the emergency department as soon as possible.  So consider constipation as a potential cause.  Meds ordered this encounter  Medications  . metroNIDAZOLE (FLAGYL) 500 MG tablet    Sig: Take 1 tablet (500 mg total) by mouth 2 (two) times daily.    Dispense:  14 tablet    Refill:  0  . sulfamethoxazole-trimethoprim (BACTRIM DS) 800-160 MG tablet    Sig: Take 1 tablet by mouth 2 (two) times daily.    Dispense:  20 tablet    Refill:  0     Beatrice Lecher, MD

## 2019-11-24 NOTE — Progress Notes (Signed)
All labs are normal. 

## 2019-11-25 ENCOUNTER — Ambulatory Visit (INDEPENDENT_AMBULATORY_CARE_PROVIDER_SITE_OTHER): Payer: BC Managed Care – PPO

## 2019-11-25 DIAGNOSIS — R1032 Left lower quadrant pain: Secondary | ICD-10-CM

## 2019-11-25 DIAGNOSIS — K5730 Diverticulosis of large intestine without perforation or abscess without bleeding: Secondary | ICD-10-CM | POA: Diagnosis not present

## 2019-11-25 MED ORDER — IOPAMIDOL (ISOVUE-300) INJECTION 61%
100.0000 mL | Freq: Once | INTRAVENOUS | Status: AC | PRN
  Administered 2019-11-25: 100 mL via INTRAVENOUS

## 2019-11-29 ENCOUNTER — Ambulatory Visit: Payer: BC Managed Care – PPO | Admitting: Sports Medicine

## 2019-12-07 ENCOUNTER — Ambulatory Visit (INDEPENDENT_AMBULATORY_CARE_PROVIDER_SITE_OTHER): Payer: BC Managed Care – PPO | Admitting: Sports Medicine

## 2019-12-07 DIAGNOSIS — M7061 Trochanteric bursitis, right hip: Secondary | ICD-10-CM | POA: Diagnosis not present

## 2019-12-07 NOTE — Assessment & Plan Note (Signed)
Amanda Novak returns, she continues to have pain directly over her right greater trochanter, worse with walking. She did not respond to conservative treatment, she was unable to really do much of the rehab exercises, because she has persistent and severe pain we proceeded with a trochanteric bursa injection today, return to see me in 1 month to reevaluate symptoms. X-rays simply showed hip osteoarthritis.

## 2019-12-07 NOTE — Progress Notes (Signed)
    Procedures performed today:    Procedure: Real-time Ultrasound Guided injection of the right greater trochanteric bursa Device: Samsung HS60  Verbal informed consent obtained.  Time-out conducted.  Noted no overlying erythema, induration, or other signs of local infection.  Skin prepped in a sterile fashion.  Local anesthesia: Topical Ethyl chloride.  With sterile technique and under real time ultrasound guidance: 1 cc Kenalog 40, 2 cc lidocaine, 2 cc bupivacaine injected easily Completed without difficulty  Pain immediately resolved suggesting accurate placement of the medication.  Advised to call if fevers/chills, erythema, induration, drainage, or persistent bleeding.  Images permanently stored and available for review in the ultrasound unit.  Impression: Technically successful ultrasound guided injection.  Independent interpretation of notes and tests performed by another provider:   None.  Impression and Recommendations:    Trochanteric bursitis, right hip Amanda Novak returns, she continues to have pain directly over her right greater trochanter, worse with walking. She did not respond to conservative treatment, she was unable to really do much of the rehab exercises, because she has persistent and severe pain we proceeded with a trochanteric bursa injection today, return to see me in 1 month to reevaluate symptoms. X-rays simply showed hip osteoarthritis.    ___________________________________________ Gwen Her. Dianah Field, M.D., ABFM., CAQSM. Primary Care and [place] Instructor of [place] [facility] of Medicine

## 2020-01-09 ENCOUNTER — Ambulatory Visit (INDEPENDENT_AMBULATORY_CARE_PROVIDER_SITE_OTHER): Payer: BC Managed Care – PPO | Admitting: Sports Medicine

## 2020-01-09 ENCOUNTER — Other Ambulatory Visit: Payer: Self-pay

## 2020-01-09 DIAGNOSIS — M7061 Trochanteric bursitis, right hip: Secondary | ICD-10-CM

## 2020-01-09 NOTE — Assessment & Plan Note (Signed)
Minah returns, she is a pleasant 64 year old female, she had pain directly over her greater trochanter, worse with walking, we injected her at the last visit, she returns today pain-free. She still has some popping, and she was concerned her hip joint was popping out of place but on exam this pop occurs when going up stairs, and simply her hip abductors popping over her greater trochanter bony prominence. She will continue her hip abduction rehab exercises at home and return to see me as needed.

## 2020-01-09 NOTE — Progress Notes (Signed)
    Procedures performed today:    None.  Independent interpretation of notes and tests performed by another provider:   None.  Brief History, Exam, Impression, and Recommendations:    Trochanteric bursitis, right hip Rielyn returns, she is a pleasant 64 year old female, she had pain directly over her greater trochanter, worse with walking, we injected her at the last visit, she returns today pain-free. She still has some popping, and she was concerned her hip joint was popping out of place but on exam this pop occurs when going up stairs, and simply her hip abductors popping over her greater trochanter bony prominence. She will continue her hip abduction rehab exercises at home and return to see me as needed.    ___________________________________________ Gwen Her. Dianah Field, M.D., ABFM., CAQSM. Primary Care and [place] Instructor of [place] of [facility] of Medicine

## 2020-01-13 DIAGNOSIS — Z1231 Encounter for screening mammogram for malignant neoplasm of breast: Secondary | ICD-10-CM | POA: Diagnosis not present

## 2020-01-16 DIAGNOSIS — M25832 Other specified joint disorders, left wrist: Secondary | ICD-10-CM | POA: Diagnosis not present

## 2020-01-16 DIAGNOSIS — G5602 Carpal tunnel syndrome, left upper limb: Secondary | ICD-10-CM | POA: Diagnosis not present

## 2020-02-10 DIAGNOSIS — G5602 Carpal tunnel syndrome, left upper limb: Secondary | ICD-10-CM | POA: Diagnosis not present

## 2020-02-10 DIAGNOSIS — M25832 Other specified joint disorders, left wrist: Secondary | ICD-10-CM | POA: Diagnosis not present

## 2020-02-10 DIAGNOSIS — M24832 Other specific joint derangements of left wrist, not elsewhere classified: Secondary | ICD-10-CM | POA: Diagnosis not present

## 2020-02-10 DIAGNOSIS — M94232 Chondromalacia, left wrist: Secondary | ICD-10-CM | POA: Diagnosis not present

## 2020-02-10 HISTORY — PX: CARPAL TUNNEL RELEASE: SHX101

## 2020-02-16 DIAGNOSIS — Z124 Encounter for screening for malignant neoplasm of cervix: Secondary | ICD-10-CM | POA: Diagnosis not present

## 2020-02-16 DIAGNOSIS — Z1151 Encounter for screening for human papillomavirus (HPV): Secondary | ICD-10-CM | POA: Diagnosis not present

## 2020-10-05 ENCOUNTER — Telehealth: Payer: Self-pay

## 2020-10-05 DIAGNOSIS — E782 Mixed hyperlipidemia: Secondary | ICD-10-CM

## 2020-10-05 DIAGNOSIS — E559 Vitamin D deficiency, unspecified: Secondary | ICD-10-CM

## 2020-10-05 NOTE — Telephone Encounter (Signed)
Pt called stating she has no medical insurance she needs lab work to get a refill on her Lipitor.

## 2020-10-08 MED ORDER — ATORVASTATIN CALCIUM 20 MG PO TABS: ORAL_TABLET | ORAL | 0 refills | Status: DC

## 2020-10-08 NOTE — Telephone Encounter (Signed)
I did send over refill for 90 days. The lab downstairs does do a self pay discount that you can price. Can I send downstairs and you done in the next 3 months. Do you expect insurance in near future?

## 2020-10-09 NOTE — Addendum Note (Signed)
Addended by: Vivia [place] on: 10/09/2020 09:28 AM   Modules accepted: Orders

## 2020-10-09 NOTE — Telephone Encounter (Signed)
Ok to order vitamin D, cmp, lipid panel

## 2020-10-09 NOTE — Telephone Encounter (Signed)
Pt is advised of her lab work for her vit D cmp lipid panel.

## 2020-10-09 NOTE — Telephone Encounter (Signed)
Pt is advised of her refill, she stated that she wanted to get lab work done for a vitamin D for her bones.

## 2021-01-03 ENCOUNTER — Other Ambulatory Visit: Payer: Self-pay | Admitting: Physician Assistant

## 2021-01-10 ENCOUNTER — Other Ambulatory Visit: Payer: Self-pay | Admitting: Physician Assistant

## 2021-01-28 ENCOUNTER — Other Ambulatory Visit: Payer: Self-pay

## 2021-01-28 ENCOUNTER — Ambulatory Visit: Payer: Self-pay | Admitting: Physician Assistant

## 2021-01-28 ENCOUNTER — Encounter: Payer: Self-pay | Admitting: Physician Assistant

## 2021-01-28 VITALS — BP 137/70 | HR 70 | Temp 97.9°F | Resp 20 | Ht 64.0 in | Wt 162.0 lb

## 2021-01-28 DIAGNOSIS — E538 Deficiency of other specified B group vitamins: Secondary | ICD-10-CM

## 2021-01-28 DIAGNOSIS — J0100 Acute maxillary sinusitis, unspecified: Secondary | ICD-10-CM

## 2021-01-28 DIAGNOSIS — E559 Vitamin D deficiency, unspecified: Secondary | ICD-10-CM

## 2021-01-28 DIAGNOSIS — E785 Hyperlipidemia, unspecified: Secondary | ICD-10-CM

## 2021-01-28 MED ORDER — AMOXICILLIN-POT CLAVULANATE 875-125 MG PO TABS: 1.0000 | ORAL_TABLET | Freq: Two times a day (BID) | ORAL | 0 refills | Status: DC
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
## 2021-02-01 ENCOUNTER — Telehealth: Payer: Self-pay | Admitting: Neurology

## 2021-02-01 MED ORDER — ALBUTEROL SULFATE HFA 108 (90 BASE) MCG/ACT IN AERS: 2.0000 | INHALATION_SPRAY | Freq: Four times a day (QID) | RESPIRATORY_TRACT | 0 refills | Status: DC | PRN

## 2021-02-01 NOTE — Telephone Encounter (Signed)
Added albuterol 2 puffs every 4-6 hours as needed for cough.

## 2021-02-01 NOTE — Telephone Encounter (Signed)
LMOM letting patient know.

## 2021-02-01 NOTE — Telephone Encounter (Signed)
Patient seen 01/28/2021, given Augmentin and Flonase for Sinusitis. She left vm stating she has been coughing and finding it hard to catch her breath. She feels like this has gotten in her lungs and is questioning bronchitis. She wants to know if she needs another visit or if we can send her something else to help? She did take at home Covid test which was negative. Please advise.

## 2021-02-08 ENCOUNTER — Telehealth: Payer: Self-pay

## 2021-02-08 NOTE — Telephone Encounter (Signed)
Patient stopped by to say that she went [facility] to get her labs and it's going to cost here $738.00 to have these labs completed and she's not getting them completed, but if you have any other suggestions please let her know. - tvt

## 2021-02-08 NOTE — Telephone Encounter (Signed)
Pt is self pay can we call [facility] and see if that is with the self pay discount?

## 2021-02-08 NOTE — Telephone Encounter (Signed)
Amanda Novak tried to call [facility] and can not get a response, do you have a contact to ask?

## 2021-02-12 NOTE — Telephone Encounter (Signed)
I contacted Amanda Novak with [facility].  She is waiting for the billing dept to return her call.

## 2021-02-12 NOTE — Telephone Encounter (Signed)
Thank you perfect!

## 2021-02-12 NOTE — Telephone Encounter (Signed)
With the self-pay discount, pt is responsible for $199.  Pt is aware and can afford that so she will get her labs done.

## 2021-03-08 ENCOUNTER — Telehealth (INDEPENDENT_AMBULATORY_CARE_PROVIDER_SITE_OTHER): Payer: Self-pay | Admitting: Sports Medicine

## 2021-03-08 ENCOUNTER — Encounter: Payer: Self-pay | Admitting: Sports Medicine

## 2021-03-08 DIAGNOSIS — U071 COVID-19: Secondary | ICD-10-CM | POA: Insufficient documentation

## 2021-03-08 MED ORDER — PAXLOVID 20 X 150 MG & 10 X 100MG PO TBPK: 3.0000 | ORAL_TABLET | Freq: Two times a day (BID) | ORAL | 0 refills | Status: AC

## 2021-03-08 NOTE — Progress Notes (Signed)
Symptoms started on Monday: tired, falling asleep, stuffy nose. Patient was in [place]. Patient flew home on Tuesday and took home test on Wednesday which was positive. Patient reports she feels better today. Current symptoms: nasal congestion (clear), cough, ears hurting, teeth hurting,

## 2021-03-08 NOTE — Assessment & Plan Note (Signed)
Amanda Novak is a pleasant 65 year old female, not vaccinated for COVID-19, on Monday she started to develop symptoms including headache, myalgias, fatigue. She took a home test yesterday or day before and it turned out positive. She speaking full sentences on the phone, she will do DayQuil, NyQuil, isolate until day 5 of symptoms, she will quarantine for 5 days after that, I am going to add Paxlovid. Of note her husband got COVID sometime ago and ended up with a 16-day hospitalization. They still opted out of the vaccine after all of this.

## 2021-03-08 NOTE — Progress Notes (Signed)
   Virtual Visit via Telephone   I connected with  Amanda Novak  on 03/08/21 by telephone/telehealth and verified that I am speaking with the correct person using two identifiers.   I discussed the limitations, risks, security and privacy concerns of performing an evaluation and management service by telephone, including the higher likelihood of inaccurate diagnosis and treatment, and the availability of in person appointments.  We also discussed the likely need of an additional face to face encounter for complete and high quality delivery of care.  I also discussed with the patient that there may be a patient responsible charge related to this service. The patient expressed understanding and wishes to proceed.  Provider location is in medical facility. Patient location is at their home, different from provider location. People involved in care of the patient during this telehealth encounter were myself, my nurse/medical assistant, and my front office/scheduling team member.  Review of Systems: No fevers, chills, night sweats, weight loss, chest pain, or shortness of breath.   Objective Findings:    General: Speaking full sentences, no audible heavy breathing.  Sounds alert and appropriately interactive.    Independent interpretation of tests performed by another provider:   None.  Brief History, Exam, Impression, and Recommendations:    [id number] Amanda Novak is a pleasant 65 year old female, not vaccinated for COVID-19, on Monday she started to develop symptoms including headache, myalgias, fatigue. She took a home test yesterday or day before and it turned out positive. She speaking full sentences on the phone, she will do DayQuil, NyQuil, isolate until day 5 of symptoms, she will quarantine for 5 days after that, I am going to add Paxlovid. Of note her husband got COVID sometime ago and ended up with a 16-day hospitalization. They still opted out of the vaccine after all of  this.   I discussed the above assessment and treatment plan with the patient. The patient was provided an opportunity to ask questions and all were answered. The patient agreed with the plan and demonstrated an understanding of the instructions.   The patient was advised to call back or seek an in-person evaluation if the symptoms worsen or if the condition fails to improve as anticipated.   I provided 30 minutes of verbal and non-verbal time during this encounter date, time was needed to gather information, review chart, records, communicate/coordinate with staff remotely, as well as complete documentation.  Specifically I gave anticipatory guidance regarding COVID-19 in an unvaccinated patient and what to expect.   ___________________________________________ Gwen Her. Dianah Field, M.D., ABFM., CAQSM. Primary Care and Sports Medicine  [facility]  Adjunct Professor of [place] [employer] [facility] of Medicine

## 2021-03-27 ENCOUNTER — Other Ambulatory Visit: Payer: Self-pay | Admitting: Physician Assistant

## 2021-08-21 ENCOUNTER — Ambulatory Visit (INDEPENDENT_AMBULATORY_CARE_PROVIDER_SITE_OTHER): Payer: Self-pay

## 2021-08-21 ENCOUNTER — Other Ambulatory Visit: Payer: Self-pay

## 2021-08-21 ENCOUNTER — Ambulatory Visit (INDEPENDENT_AMBULATORY_CARE_PROVIDER_SITE_OTHER): Payer: Self-pay | Admitting: Physician Assistant

## 2021-08-21 ENCOUNTER — Encounter: Payer: Self-pay | Admitting: Physician Assistant

## 2021-08-21 ENCOUNTER — Other Ambulatory Visit: Payer: Self-pay | Admitting: Physician Assistant

## 2021-08-21 VITALS — BP 137/58 | HR 66 | Temp 98.5°F | Wt 165.0 lb

## 2021-08-21 DIAGNOSIS — M549 Dorsalgia, unspecified: Secondary | ICD-10-CM

## 2021-08-21 DIAGNOSIS — D229 Melanocytic nevi, unspecified: Secondary | ICD-10-CM

## 2021-08-21 DIAGNOSIS — R079 Chest pain, unspecified: Secondary | ICD-10-CM

## 2021-08-21 MED ORDER — CYCLOBENZAPRINE HCL 10 MG PO TABS: 10.0000 mg | ORAL_TABLET | Freq: Three times a day (TID) | ORAL | 0 refills | Status: DC | PRN

## 2021-08-21 NOTE — Patient Instructions (Addendum)
Continue to treat with tylenol and icy hot sent flexeril to try.  Will get xray.  Will biopsy mole today  Skin Biopsy, Care After The following information offers guidance on how to care for yourself after your procedure. Your health care provider may also give you more specific instructions. If you have problems or questions, contact your health care provider. What can I expect after the procedure? After the procedure, it is common to have: Soreness or mild pain. Bruising. Itching. Some redness and swelling. Follow these instructions at home: Biopsy site care  Follow instructions from your health care provider about how to take care of your biopsy site. Make sure you: Wash your hands with soap and water for at least 20 seconds before and after you change your bandage (dressing). If soap and water are not available, use hand sanitizer. Change your dressing as told by your health care provider. Leave stitches (sutures), skin glue, or adhesive strips in place. These skin closures may need to stay in place for 2 weeks or longer. If adhesive strip edges start to loosen and curl up, you may trim the loose edges. Do not remove adhesive strips completely unless your health care provider tells you to do that. Check your biopsy site every day for signs of infection. Check for: More redness, swelling, or pain. Fluid or blood. Warmth. Pus or a bad smell. Do not take baths, swim, or use a hot tub until your health care provider approves. Ask your health care provider if you may take showers. You may only be allowed to take sponge baths. General instructions Take over-the-counter and prescription medicines only as told by your health care provider. Return to your normal activities as told by your health care provider. Ask your health care provider what activities are safe for you. Keep all follow-up visits. This is important. Contact a health care provider if: You have more redness, swelling, or  pain around your biopsy site. You have fluid or blood coming from your biopsy site. Your biopsy site feels warm to the touch. You have pus or a bad smell coming from your biopsy site. You have a fever. Your sutures, skin glue, or adhesive strips loosen or come off sooner than expected. Get help right away if: You have bleeding that does not stop with pressure or a dressing. Summary After the procedure, it is common to have soreness, bruising, and itching at the site. Follow instructions from your health care provider about how to take care of your biopsy site. Check your biopsy site every day for signs of infection. Contact a health care provider if you have more redness, swelling, or pain around your biopsy site, or your biopsy site feels warm to the touch. Keep all follow-up visits. This is important. This information is not intended to replace advice given to you by your health care provider. Make sure you discuss any questions you have with your health care provider. Document Revised: 04/09/2021 Document Reviewed: 04/09/2021 Elsevier Patient Education  [place].
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
## 2021-08-23 DIAGNOSIS — M549 Dorsalgia, unspecified: Secondary | ICD-10-CM | POA: Insufficient documentation

## 2021-08-23 DIAGNOSIS — D229 Melanocytic nevi, unspecified: Secondary | ICD-10-CM | POA: Insufficient documentation

## 2021-08-23 NOTE — Progress Notes (Signed)
Normal chest and rib xray

## 2021-08-28 ENCOUNTER — Other Ambulatory Visit: Payer: Self-pay

## 2021-08-28 ENCOUNTER — Ambulatory Visit (INDEPENDENT_AMBULATORY_CARE_PROVIDER_SITE_OTHER): Payer: Self-pay | Admitting: Physician Assistant

## 2021-08-28 ENCOUNTER — Encounter: Payer: Self-pay | Admitting: Physician Assistant

## 2021-08-28 VITALS — BP 124/66 | HR 60 | Ht 64.0 in | Wt 162.0 lb

## 2021-08-28 DIAGNOSIS — M549 Dorsalgia, unspecified: Secondary | ICD-10-CM

## 2021-08-28 DIAGNOSIS — Z4802 Encounter for removal of sutures: Secondary | ICD-10-CM

## 2021-08-28 DIAGNOSIS — L821 Other seborrheic keratosis: Secondary | ICD-10-CM

## 2021-08-28 NOTE — Progress Notes (Signed)
   Subjective:    Patient ID: Amanda Novak, female    DOB: Jul 30, 1956, 65 y.o.   MRN: [id number]  HPI Pt is here to have suture removed after punch biopsy and to go over pathology.   Right mid back pain has improved.     Review of Systems See HPI>     Objective:   Physical Exam   One suture removed from right upper back without complication.     Assessment & Plan:  Marland KitchenMarland KitchenDorise was seen today for follow-up.  Diagnoses and all orders for this visit:  Encounter for removal of sutures  Seborrheic keratoses  Mid back pain on right side   Reviewed biopsy report for benign seborrheic keratosis.   Right mid back pain improved. Continue to treat with as needed muscle relaxer, heat, NSAID, icy hot, tens unit, massage. Follow up as needed.

## 2021-09-30 ENCOUNTER — Telehealth: Payer: Self-pay | Admitting: General Practice
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
## 2021-10-22 ENCOUNTER — Ambulatory Visit (INDEPENDENT_AMBULATORY_CARE_PROVIDER_SITE_OTHER): Payer: Self-pay | Admitting: Sports Medicine

## 2021-10-22 ENCOUNTER — Ambulatory Visit (INDEPENDENT_AMBULATORY_CARE_PROVIDER_SITE_OTHER): Payer: Self-pay

## 2021-10-22 ENCOUNTER — Other Ambulatory Visit: Payer: Self-pay

## 2021-10-22 DIAGNOSIS — S99911A Unspecified injury of right ankle, initial encounter: Secondary | ICD-10-CM | POA: Insufficient documentation

## 2021-10-22 DIAGNOSIS — M79671 Pain in right foot: Secondary | ICD-10-CM

## 2021-10-22 DIAGNOSIS — M25571 Pain in right ankle and joints of right foot: Secondary | ICD-10-CM
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
## 2021-10-22 NOTE — Progress Notes (Signed)
° ° °  Procedures performed today:    None.  Independent interpretation of notes and tests performed by another provider:   None.  Brief History, Exam, Impression, and Recommendations:    Right ankle injury Pleasant 66 year old female, inverted her right ankle at the beginning of the month, x-rays at an outside facility of the foot and ankle were negative for fracture, on exam she has mild swelling, a lot of pain with palpation almost anywhere on the foot and ankle. Negative anterior drawer sign, no obvious tenderness over bony prominences. We will get an updated set of x-rays today, continue ASO, no insurance so we will hold off on physical therapy, we will consider advanced imaging in 2-3 more weeks if still hurting. Minimize weightbearing, unable to use NSAIDs or other pain medications per her report.    ___________________________________________ Gwen Her. Dianah Field, M.D., ABFM., CAQSM. Primary Care and [place] Instructor of [place] of [facility] of Medicine

## 2021-10-30 ENCOUNTER — Other Ambulatory Visit: Payer: Self-pay

## 2021-10-30 ENCOUNTER — Telehealth (INDEPENDENT_AMBULATORY_CARE_PROVIDER_SITE_OTHER): Payer: Self-pay | Admitting: Medical-Surgical

## 2021-10-30 DIAGNOSIS — J0100 Acute maxillary sinusitis, unspecified: Secondary | ICD-10-CM

## 2021-10-30 MED ORDER — AZITHROMYCIN 250 MG PO TABS: ORAL_TABLET | ORAL | 0 refills | Status: AC

## 2021-10-30 NOTE — Progress Notes (Signed)
Virtual Visit via Video Note  I connected with Amanda Novak on 10/30/21 at  9:50 AM EST by a video enabled telemedicine application and verified that I am speaking with the correct person using two identifiers.   I discussed the limitations of evaluation and management by telemedicine and the availability of in person appointments. The patient expressed understanding and agreed to proceed.  Patient location: home Provider locations: office  Subjective:    CC: Upper respiratory symptoms  HPI: Pleasant 66 year old female presenting today via [place] video visit with reports of upper respiratory symptoms including sinus congestion, facial pain/pressure, ear pain/pressure, and green nasal discharge for the last 3 days.  Also notes a sore throat, left cheek pain, and dental pain.  Her husband was recently ill and she developed symptoms shortly after him.  Denies fever, chills, chest pain, shortness of breath, and GI symptoms.  Has been trying Mucinex DM and DayQuil at home with minimal relief of symptoms.  Past medical history, Surgical history, Family history not pertinant except as noted below, Social history, Allergies, and medications have been entered into the medical record, reviewed, and corrections made.   Review of Systems: See HPI for pertinent positives and negatives.   Objective:    General: Speaking clearly in complete sentences without any shortness of breath.  Alert and oriented x3.  Normal judgment. No apparent acute distress.  Impression and Recommendations:    1. Acute non-recurrent maxillary sinusitis Discussed the likely viral etiology of her symptoms but with the severity of her facial pain, dental pain, and significant nasal discharge, we will go ahead and treat with a azithromycin 500 mg today followed by 250 mg for the next 4 days.  Continue symptomatic treatment at home.  Okay to use decongestants or over-the-counter cough/cold medications.  Consider adding saline  nasal spray or rinses.  I discussed the assessment and treatment plan with the patient. The patient was provided an opportunity to ask questions and all were answered. The patient agreed with the plan and demonstrated an understanding of the instructions.   The patient was advised to call back or seek an in-person evaluation if the symptoms worsen or if the condition fails to improve as anticipated.  25 minutes of non-face-to-face time was provided during this encounter.  Return if symptoms worsen or fail to improve.  Clearnce Sorrel, DNP, APRN, FNP-BC [facility]

## 2021-10-30 NOTE — Progress Notes (Signed)
Nasal congestion Feels like it's a sinus infection, green mucus Headache, eye pain, cheeks hurt  About 3 days Self pay

## 2021-11-12 ENCOUNTER — Ambulatory Visit: Payer: Self-pay | Admitting: Sports Medicine

## 2022-03-24 ENCOUNTER — Ambulatory Visit: Payer: Self-pay | Admitting: Sports Medicine

## 2022-03-27 ENCOUNTER — Ambulatory Visit: Payer: Self-pay | Admitting: Sports Medicine

## 2022-04-08 ENCOUNTER — Ambulatory Visit: Payer: Self-pay | Admitting: Sports Medicine

## 2022-08-01 ENCOUNTER — Ambulatory Visit: Payer: Self-pay | Admitting: Family Medicine

## 2022-08-01 ENCOUNTER — Ambulatory Visit (INDEPENDENT_AMBULATORY_CARE_PROVIDER_SITE_OTHER): Payer: Medicare HMO | Admitting: Physician Assistant

## 2022-08-01 VITALS — BP 163/70 | HR 67 | Temp 98.8°F | Ht 64.0 in | Wt 167.0 lb

## 2022-08-01 DIAGNOSIS — R030 Elevated blood-pressure reading, without diagnosis of hypertension: Secondary | ICD-10-CM

## 2022-08-01 DIAGNOSIS — J0100 Acute maxillary sinusitis, unspecified: Secondary | ICD-10-CM

## 2022-08-01 DIAGNOSIS — R0982 Postnasal drip: Secondary | ICD-10-CM

## 2022-08-01 LAB — POCT INFLUENZA A/B
Influenza A, POC: NEGATIVE
Influenza B, POC: NEGATIVE

## 2022-08-01 MED ORDER — AMOXICILLIN-POT CLAVULANATE 875-125 MG PO TABS: 1.0000 | ORAL_TABLET | Freq: Two times a day (BID) | ORAL | 0 refills | Status: DC

## 2022-08-01 NOTE — Progress Notes (Signed)
   Acute Office Visit  Subjective:     Patient ID: Amanda Novak, female    DOB: [birth year], 66 y.o.   MRN: [id number]  Chief Complaint  Patient presents with  . Sinus Problem    HPI Patient is in today for  3-5 days .  ROS      Objective:    BP (!) 163/70   Pulse 67   Temp 98.8 F (37.1 C) (Oral)   Ht 5' 4"  (1.626 m)   Wt 167 lb (75.8 kg)   SpO2 99%   BMI 28.67 kg/m  {Vitals History (Optional):23777}  Physical Exam  No results found for any visits on 08/01/22.      Assessment & Plan:   Problem List Items Addressed This Visit   None Visit Diagnoses     Post-nasal drip    -  Primary   Relevant Orders   POCT Influenza A/B       No orders of the defined types were placed in this encounter.   No follow-ups on file.  Iran Planas, PA-C

## 2022-08-01 NOTE — Patient Instructions (Signed)

## 2022-08-04 ENCOUNTER — Encounter: Payer: Self-pay | Admitting: Physician Assistant

## 2022-09-24 ENCOUNTER — Ambulatory Visit (INDEPENDENT_AMBULATORY_CARE_PROVIDER_SITE_OTHER): Payer: Medicare HMO | Admitting: Physician Assistant

## 2022-09-24 ENCOUNTER — Encounter: Payer: Self-pay | Admitting: Physician Assistant

## 2022-09-24 VITALS — BP 147/72 | HR 57 | Ht 64.0 in | Wt 170.1 lb

## 2022-09-24 DIAGNOSIS — Z1329 Encounter for screening for other suspected endocrine disorder: Secondary | ICD-10-CM

## 2022-09-24 DIAGNOSIS — Z0000 Encounter for general adult medical examination without abnormal findings: Secondary | ICD-10-CM | POA: Diagnosis not present

## 2022-09-24 DIAGNOSIS — E538 Deficiency of other specified B group vitamins: Secondary | ICD-10-CM | POA: Diagnosis not present

## 2022-09-24 DIAGNOSIS — E559 Vitamin D deficiency, unspecified: Secondary | ICD-10-CM

## 2022-09-24 DIAGNOSIS — Z13820 Encounter for screening for osteoporosis: Secondary | ICD-10-CM

## 2022-09-24 DIAGNOSIS — E785 Hyperlipidemia, unspecified: Secondary | ICD-10-CM | POA: Diagnosis not present

## 2022-09-24 DIAGNOSIS — M2560 Stiffness of unspecified joint, not elsewhere classified: Secondary | ICD-10-CM

## 2022-09-24 DIAGNOSIS — M549 Dorsalgia, unspecified: Secondary | ICD-10-CM

## 2022-09-24 DIAGNOSIS — Z1231 Encounter for screening mammogram for malignant neoplasm of breast: Secondary | ICD-10-CM | POA: Diagnosis not present

## 2022-09-24 DIAGNOSIS — M2550 Pain in unspecified joint: Secondary | ICD-10-CM | POA: Diagnosis not present

## 2022-09-24 DIAGNOSIS — Z23 Encounter for immunization: Secondary | ICD-10-CM | POA: Diagnosis not present

## 2022-09-24 DIAGNOSIS — N6019 Diffuse cystic mastopathy of unspecified breast: Secondary | ICD-10-CM

## 2022-09-24 DIAGNOSIS — R7303 Prediabetes: Secondary | ICD-10-CM | POA: Diagnosis not present

## 2022-09-24 NOTE — Progress Notes (Signed)
Complete physical exam  Patient: Amanda Novak   DOB: 1956-09-14   67 y.o. Female  MRN: [id number]  Subjective:    Chief Complaint  Patient presents with   Annual Exam    Skin tag in left inner thigh and check for RA    Amanda Novak is a 67 y.o. female who presents today for a complete physical exam. She reports consuming a general diet. The patient does not participate in regular exercise at present. She generally feels well. She reports sleeping well. She does have additional problems to discuss today.   Pt is having lots of joint pain and stiffness all over her body. She wonders about RA because her grandmother and mother had this.     Most recent fall risk assessment:    09/24/2022    7:46 AM  Fall Risk   Falls in the past year? 0  Number falls in past yr: 0  Injury with Fall? 0  Risk for fall due to : No Fall Risks  Follow up Falls evaluation completed     Most recent depression screenings:    09/24/2022    7:46 AM 01/26/2018    4:19 PM  PHQ 2/9 Scores  PHQ - 2 Score 0 0    Vision:Within last year and Dental: No current dental problems and Receives regular dental care  Patient Active Problem List   Diagnosis Date Noted   Right ankle injury 10/22/2021   Mid back pain on right side 08/23/2021   Suspicious nevus 08/23/2021   COVID-19 03/08/2021   Acute non-recurrent maxillary sinusitis 01/28/2021   Trochanteric bursitis, right hip 11/07/2019   Urinary frequency 09/26/2019   Lower abdominal pain 09/26/2019   Elevated serum creatinine 09/26/2019   Abdominal pressure 07/19/2019   Urinary urgency 07/19/2019   Numbness and tingling in right hand 12/07/2018   Carpal tunnel syndrome on left 08/10/2018   Primary osteoarthritis of left wrist 06/17/2018   Pain of right thumb 02/17/2018   Neck pain 02/17/2018   DDD (degenerative disc disease), cervical 02/17/2018   Family history of rheumatoid arthritis 01/31/2018   Bilateral hand pain 01/29/2018   Fatty liver  disease, nonalcoholic 60/63/0160   Gallbladder polyp 10/07/2017   Trigger thumb, right thumb 12/03/2016   Hemangioma of bone 11/15/2015   GERD (gastroesophageal reflux disease) 11/15/2015   NASH (nonalcoholic steatohepatitis) 11/15/2015   Vitamin B 12 deficiency 10/10/2015   Prediabetes 10/10/2015   Hyperlipidemia 10/09/2015   Vitamin D deficiency 10/09/2015   Memory loss 10/09/2015   Past Medical History:  Diagnosis Date   Fibromyalgia    GERD (gastroesophageal reflux disease)    Hyperlipidemia 10/09/2015   Lactose intolerance    Nipple discharge    Family History  Problem Relation Age of Onset   Alzheimer's disease Mother    Rheum arthritis Mother    Allergies  Allergen Reactions   Tramadol Itching   Codeine Hives and Itching   Hydrocodone Hives and Itching   Zyrtec [Cetirizine] Other (See Comments)    Headache      Patient Care Team: Lavada Mesi as PCP - General (Family Medicine)   Outpatient Medications Prior to Visit  Medication Sig   acetaminophen (TYLENOL) 500 MG tablet Take 1,000 mg by mouth every 6 (six) hours as needed.   Ascorbic Acid (VITAMIN C) 1000 MG tablet Take 1,000 mg by mouth daily.   BIOTIN PO Take 1 tablet by mouth daily.   cholecalciferol (VITAMIN D) 1000 units tablet Take 2,000  Units by mouth daily.   Cyanocobalamin (B-12) 500 MCG TABS Take 500 mcg by mouth daily. Please follow-up with Jeromiah Ohalloran to recheck B12 levels 6-8 weeks after starting oral medications   pantoprazole (PROTONIX) 40 MG tablet TAKE 1 TABLET(40 MG) BY MOUTH TWICE DAILY BEFORE A MEAL   Red Yeast Rice Extract (RED YEAST RICE PO) Take by mouth.   Zinc 50 MG CAPS Take 1 capsule by mouth daily.   [DISCONTINUED] amoxicillin-clavulanate (AUGMENTIN) 875-125 MG tablet Take 1 tablet by mouth 2 (two) times daily.   No facility-administered medications prior to visit.    ROS See HPI.      Objective:     BP (!) 147/72   Pulse (!) 57   Ht '5\' 4"'$  (1.626 m)   Wt 170 lb 1.3  oz (77.1 kg)   SpO2 98%   BMI 29.19 kg/m  BP Readings from Last 3 Encounters:  09/24/22 (!) 147/72  08/01/22 (!) 163/70  08/28/21 124/66   Wt Readings from Last 3 Encounters:  09/24/22 170 lb 1.3 oz (77.1 kg)  08/01/22 167 lb (75.8 kg)  08/28/21 162 lb (73.5 kg)      Physical Exam Vitals reviewed.  Constitutional:      Appearance: Normal appearance.  HENT:     Head: Normocephalic.     Right Ear: Tympanic membrane, ear canal and external ear normal. There is no impacted cerumen.     Left Ear: Tympanic membrane, ear canal and external ear normal. There is no impacted cerumen.     Nose: Nose normal.     Mouth/Throat:     Mouth: Mucous membranes are moist.  Eyes:     Conjunctiva/sclera: Conjunctivae normal.  Cardiovascular:     Rate and Rhythm: Normal rate and regular rhythm.  Pulmonary:     Effort: Pulmonary effort is normal.     Breath sounds: Normal breath sounds.  Abdominal:     General: Bowel sounds are normal.     Palpations: Abdomen is soft.  Musculoskeletal:     Cervical back: Normal range of motion and neck supple.     Right lower leg: No edema.     Left lower leg: No edema.  Neurological:     General: No focal deficit present.     Mental Status: She is alert and oriented to person, place, and time.  Psychiatric:        Mood and Affect: Mood normal.         Assessment & Plan:    Routine Health Maintenance and Physical Exam  Immunization History  Administered Date(s) Administered   Fluad Quad(high Dose 65+) 09/24/2022   Hepatitis B, PED/ADOLESCENT 01/03/2013, 03/10/2013   Hepatitis B, adult 01/03/2013, 03/10/2013   Influenza Inj Mdck Quad Pf 07/16/2017   Influenza Split 06/07/2013   Influenza,inj,Quad PF,6+ Mos 06/09/2016, 06/17/2018, 07/18/2019   Influenza,trivalent, recombinat, inj, PF 07/27/2012, 06/23/2014, 06/12/2015   Influenza-Unspecified 06/23/2015   PPD Test 11/21/2015, 01/26/2018   Tdap 01/03/2013   Zoster Recombinat (Shingrix)  06/17/2018, 08/18/2018    Health Maintenance  Topic Date Due   Medicare Annual Wellness (AWV)  Never done   PAP SMEAR-Modifier  06/07/2017   COLONOSCOPY (Pts 45-40yr Insurance coverage will need to be confirmed)  01/26/2022   Pneumonia Vaccine 66 Years old (1 - PCV) 09/25/2023 (Originally 06/26/2022)   MAMMOGRAM  09/25/2023 (Originally 01/20/2020)   DEXA SCAN  09/25/2023 (Originally 06/26/2022)   HIV Screening  09/25/2023 (Originally 06/26/1971)   DTaP/Tdap/Td (2 - Td or Tdap) 01/04/2023  INFLUENZA VACCINE  Completed   Hepatitis C Screening  Completed   Zoster Vaccines- Shingrix  Completed   HPV VACCINES  Aged Out   COVID-19 Vaccine  Discontinued    Discussed health benefits of physical activity, and encouraged her to engage in regular exercise appropriate for her age and condition. Marland KitchenNunzio Cory was seen today for annual exam.  Diagnoses and all orders for this visit:  Routine physical examination -     TSH -     Lipid Panel w/reflex Direct LDL -     COMPLETE METABOLIC PANEL WITH GFR -     CBC with Differential/Platelet -     Vitamin D (25 hydroxy) -     B12 and Folate Panel -     Hemoglobin A1c  Hyperlipidemia, unspecified hyperlipidemia type -     Lipid Panel w/reflex Direct LDL  Vitamin D deficiency -     Vitamin D (25 hydroxy)  Vitamin B 12 deficiency -     B12 and Folate Panel  Prediabetes -     COMPLETE METABOLIC PANEL WITH GFR -     Hemoglobin A1c  Thyroid disorder screen -     TSH  Fibrocystic breast disease (FCBD), unspecified laterality  Visit for screening mammogram -     MM 3D SCREEN BREAST BILATERAL  Osteoporosis screening -     DG Bone Density; Future  Joint stiffness -     ANA Screen,IFA,Reflex Titer/Pattern,Reflex Mplx 11 Ab Cascade with IdentRA -     Sed Rate (ESR) -     ANA Screen,IFA,Reflex Titer/Pattern,Reflex Mplx 11 Ab Cascade with IdentRA -     Sedimentation rate  Arthralgia, unspecified joint -     ANA Screen,IFA,Reflex  Titer/Pattern,Reflex Mplx 11 Ab Cascade with IdentRA -     Sed Rate (ESR) -     ANA Screen,IFA,Reflex Titer/Pattern,Reflex Mplx 11 Ab Cascade with IdentRA -     Sedimentation rate  Need for immunization against influenza -     Flu Vaccine QUAD High Dose(Fluad)   .Marland Kitchen Discussed 150 minutes of exercise a week.  Encouraged vitamin D 1000 units and Calcium '1300mg'$  or 4 servings of dairy a day.  Flu shot given Mammogram and bone density ordered Pt has colonoscopy scheduled Fasting labs ordered ANA ordered to rule out RA Consider tumeric for inflammation Discussed appt with Dr. Darene Lamer for mid back pain work up  Discussed good exercises for DDD Follow up in 6 months.      Iran Planas, PA-C

## 2022-09-24 NOTE — Patient Instructions (Signed)
Turmeric '500mg'$  twice a day.  Dr. Darene Lamer for mid back pain  Health Maintenance After Age 66 After age 51, you are at a higher risk for certain long-term diseases and infections as well as injuries from falls. Falls are a major cause of broken bones and head injuries in people who are older than age 24. Getting regular preventive care can help to keep you healthy and well. Preventive care includes getting regular testing and making lifestyle changes as recommended by your health care provider. Talk with your health care provider about: Which screenings and tests you should have. A screening is a test that checks for a disease when you have no symptoms. A diet and exercise plan that is right for you. What should I know about screenings and tests to prevent falls? Screening and testing are the best ways to find a health problem early. Early diagnosis and treatment give you the best chance of managing medical conditions that are common after age 14. Certain conditions and lifestyle choices may make you more likely to have a fall. Your health care provider may recommend: Regular vision checks. Poor vision and conditions such as cataracts can make you more likely to have a fall. If you wear glasses, make sure to get your prescription updated if your vision changes. Medicine review. Work with your health care provider to regularly review all of the medicines you are taking, including over-the-counter medicines. Ask your health care provider about any side effects that may make you more likely to have a fall. Tell your health care provider if any medicines that you take make you feel dizzy or sleepy. Strength and balance checks. Your health care provider may recommend certain tests to check your strength and balance while standing, walking, or changing positions. Foot health exam. Foot pain and numbness, as well as not wearing proper footwear, can make you more likely to have a fall. Screenings,  including: Osteoporosis screening. Osteoporosis is a condition that causes the bones to get weaker and break more easily. Blood pressure screening. Blood pressure changes and medicines to control blood pressure can make you feel dizzy. Depression screening. You may be more likely to have a fall if you have a fear of falling, feel depressed, or feel unable to do activities that you used to do. Alcohol use screening. Using too much alcohol can affect your balance and may make you more likely to have a fall. Follow these instructions at home: Lifestyle Do not drink alcohol if: Your health care provider tells you not to drink. If you drink alcohol: Limit how much you have to: 0-1 drink a day for women. 0-2 drinks a day for men. Know how much alcohol is in your drink. In the [place] one drink equals one 12 oz bottle of beer (355 mL), one 5 oz glass of wine (148 mL), or one 1 oz glass of hard liquor (44 mL). Do not use any products that contain nicotine or tobacco. These products include cigarettes, chewing tobacco, and vaping devices, such as e-cigarettes. If you need help quitting, ask your health care provider. Activity  Follow a regular exercise program to stay fit. This will help you maintain your balance. Ask your health care provider what types of exercise are appropriate for you. If you need a cane or walker, use it as recommended by your health care provider. Wear supportive shoes that have nonskid soles. Safety  Remove any tripping hazards, such as rugs, cords, and clutter. Install safety equipment such as  grab bars in bathrooms and safety rails on stairs. Keep rooms and walkways well-lit. General instructions Talk with your health care provider about your risks for falling. Tell your health care provider if: You fall. Be sure to tell your health care provider about all falls, even ones that seem minor. You feel dizzy, tiredness (fatigue), or off-balance. Take over-the-counter and  prescription medicines only as told by your health care provider. These include supplements. Eat a healthy diet and maintain a healthy weight. A healthy diet includes low-fat dairy products, low-fat (lean) meats, and fiber from whole grains, beans, and lots of fruits and vegetables. Stay current with your vaccines. Schedule regular health, dental, and eye exams. Summary Having a healthy lifestyle and getting preventive care can help to protect your health and wellness after age 31. Screening and testing are the best way to find a health problem early and help you avoid having a fall. Early diagnosis and treatment give you the best chance for managing medical conditions that are more common for people who are older than age 38. Falls are a major cause of broken bones and head injuries in people who are older than age 55. Take precautions to prevent a fall at home. Work with your health care provider to learn what changes you can make to improve your health and wellness and to prevent falls. This information is not intended to replace advice given to you by your health care provider. Make sure you discuss any questions you have with your health care provider. Document Revised: 01/28/2021 Document Reviewed: 01/28/2021 Elsevier Patient Education  [place].

## 2022-09-26 NOTE — Progress Notes (Signed)
Jaselynn,   B12  and inflammatory levels look good.  WBC and hemoglobin look good.  Thyroid is normal.  Vitamin D low make sure taking at least 2000 units a day.  A1Ci is up and in pre-diabetes range. Recheck in 6 months Consider starting metformin to help with insulin resistance, thoughts?  LDL is not to goal.  10 year CV risk is 8.5 percent and should consider starting a statin. Ok to send?   More than welcome to talk this over with you as well if needed?   Marland KitchenMarland KitchenThe 10-year ASCVD risk score (Arnett DK, et al., 2019) is: 8.5%   Values used to calculate the score:     Age: 67 years     Sex: Female     Is Non-Hispanic African American: No     Diabetic: No     Tobacco smoker: No     Systolic Blood Pressure: 371 mmHg     Is BP treated: No     HDL Cholesterol: 51 mg/dL     Total Cholesterol: 255 mg/dL

## 2022-09-27 LAB — COMPLETE METABOLIC PANEL WITH GFR
AG Ratio: 1.8 (calc) (ref 1.0–2.5)
ALT: 33 U/L — ABNORMAL HIGH (ref 6–29)
AST: 21 U/L (ref 10–35)
Albumin: 4.4 g/dL (ref 3.6–5.1)
Alkaline phosphatase (APISO): 132 U/L (ref 37–153)
BUN: 14 mg/dL (ref 7–25)
CO2: 28 mmol/L (ref 20–32)
Calcium: 9.6 mg/dL (ref 8.6–10.4)
Chloride: 105 mmol/L (ref 98–110)
Creat: 0.84 mg/dL (ref 0.50–1.05)
Globulin: 2.5 g/dL (calc) (ref 1.9–3.7)
Glucose, Bld: 105 mg/dL — ABNORMAL HIGH (ref 65–99)
Potassium: 4 mmol/L (ref 3.5–5.3)
Sodium: 142 mmol/L (ref 135–146)
Total Bilirubin: 0.6 mg/dL (ref 0.2–1.2)
Total Protein: 6.9 g/dL (ref 6.1–8.1)
eGFR: 77 mL/min/{1.73_m2} (ref >=60)

## 2022-09-27 LAB — CBC WITH DIFFERENTIAL/PLATELET
Absolute Monocytes: 451 cells/uL (ref 200–950)
Basophils Absolute: 37 cells/uL (ref 0–200)
Basophils Relative: 0.5 %
Eosinophils Absolute: 37 cells/uL (ref 15–500)
Eosinophils Relative: 0.5 %
HCT: 39.6 % (ref 35.0–45.0)
Hemoglobin: 13.5 g/dL (ref 11.7–15.5)
Lymphs Abs: 2953 cells/uL (ref 850–3900)
MCH: 31.5 pg (ref 27.0–33.0)
MCHC: 34.1 g/dL (ref 32.0–36.0)
MCV: 92.3 fL (ref 80.0–100.0)
MPV: 10 fL (ref 7.5–12.5)
Monocytes Relative: 6.1 %
Neutro Abs: 3922 cells/uL (ref 1500–7800)
Neutrophils Relative %: 53 %
Platelets: 234 10*3/uL (ref 140–400)
RBC: 4.29 10*6/uL (ref 3.80–5.10)
RDW: 12.1 % (ref 11.0–15.0)
Total Lymphocyte: 39.9 %
WBC: 7.4 10*3/uL (ref 3.8–10.8)

## 2022-09-27 LAB — VITAMIN D 25 HYDROXY (VIT D DEFICIENCY, FRACTURES): Vit D, 25-Hydroxy: 28 ng/mL — ABNORMAL LOW (ref 30–100)

## 2022-09-27 LAB — ANA SCREEN,IFA,REFLEX TITER/PATTERN,REFLEX MPLX 11 AB CASCADE
Anti Nuclear Antibody (ANA): NEGATIVE
Cyclic Citrullin Peptide Ab: <16 UNITS
MUTATED CITRULLINATED VIMENTIN (MCV) AB: 22 U/mL — ABNORMAL HIGH (ref <20)
Rheumatoid fact SerPl-aCnc: <14 IU/mL (ref <14)

## 2022-09-27 LAB — LIPID PANEL W/REFLEX DIRECT LDL
Cholesterol: 255 mg/dL — ABNORMAL HIGH (ref <200)
HDL: 51 mg/dL (ref >=50)
LDL Cholesterol (Calc): 174 mg/dL (calc) — ABNORMAL HIGH
Non-HDL Cholesterol (Calc): 204 mg/dL (calc) — ABNORMAL HIGH (ref <130)
Total CHOL/HDL Ratio: 5 (calc) — ABNORMAL HIGH (ref <5.0)
Triglycerides: 154 mg/dL — ABNORMAL HIGH (ref <150)

## 2022-09-27 LAB — HEMOGLOBIN A1C
Hgb A1c MFr Bld: 6 % of total Hgb — ABNORMAL HIGH (ref <5.7)
Mean Plasma Glucose: 126 mg/dL
eAG (mmol/L): 7 mmol/L

## 2022-09-27 LAB — SEDIMENTATION RATE: Sed Rate: 19 mm/h (ref 0–30)

## 2022-09-27 LAB — B12 AND FOLATE PANEL
Folate: 15.8 ng/mL
Vitamin B-12: 473 pg/mL (ref 200–1100)

## 2022-09-27 LAB — TSH: TSH: 2.6 mIU/L (ref 0.40–4.50)

## 2022-10-01 DIAGNOSIS — K219 Gastro-esophageal reflux disease without esophagitis: Secondary | ICD-10-CM | POA: Diagnosis not present

## 2022-10-01 DIAGNOSIS — K5900 Constipation, unspecified: Secondary | ICD-10-CM | POA: Diagnosis not present

## 2022-10-01 DIAGNOSIS — Z1211 Encounter for screening for malignant neoplasm of colon: Secondary | ICD-10-CM | POA: Diagnosis not present

## 2022-10-01 DIAGNOSIS — K760 Fatty (change of) liver, not elsewhere classified: Secondary | ICD-10-CM | POA: Diagnosis not present

## 2022-10-02 ENCOUNTER — Other Ambulatory Visit: Payer: Self-pay | Admitting: Neurology

## 2022-10-02 MED ORDER — ATORVASTATIN CALCIUM 20 MG PO TABS: ORAL_TABLET | ORAL | 3 refills | Status: DC

## 2022-10-03 NOTE — Progress Notes (Signed)
Agree with starting statin. Recheck A1c and lipid in 6 months.

## 2022-10-14 DIAGNOSIS — I4891 Unspecified atrial fibrillation: Secondary | ICD-10-CM | POA: Diagnosis not present

## 2022-10-14 DIAGNOSIS — Z1211 Encounter for screening for malignant neoplasm of colon: Secondary | ICD-10-CM | POA: Diagnosis not present

## 2022-10-14 DIAGNOSIS — K5730 Diverticulosis of large intestine without perforation or abscess without bleeding: Secondary | ICD-10-CM | POA: Diagnosis not present

## 2022-10-14 LAB — HM COLONOSCOPY

## 2022-10-27 ENCOUNTER — Ambulatory Visit (INDEPENDENT_AMBULATORY_CARE_PROVIDER_SITE_OTHER): Payer: Medicare HMO | Admitting: Physician Assistant

## 2022-10-27 ENCOUNTER — Encounter: Payer: Self-pay | Admitting: Physician Assistant

## 2022-10-27 VITALS — BP 129/62 | HR 64 | Ht 64.0 in | Wt 168.0 lb

## 2022-10-27 DIAGNOSIS — E559 Vitamin D deficiency, unspecified: Secondary | ICD-10-CM | POA: Diagnosis not present

## 2022-10-27 DIAGNOSIS — R030 Elevated blood-pressure reading, without diagnosis of hypertension: Secondary | ICD-10-CM

## 2022-10-27 DIAGNOSIS — E785 Hyperlipidemia, unspecified: Secondary | ICD-10-CM | POA: Diagnosis not present

## 2022-10-27 DIAGNOSIS — R7303 Prediabetes: Secondary | ICD-10-CM | POA: Diagnosis not present

## 2022-10-27 DIAGNOSIS — E538 Deficiency of other specified B group vitamins: Secondary | ICD-10-CM | POA: Diagnosis not present
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
## 2022-10-27 NOTE — Patient Instructions (Signed)

## 2022-11-03 ENCOUNTER — Ambulatory Visit
Admission: EM | Admit: 2022-11-03 | Discharge: 2022-11-03 | Disposition: A | Discharge status: Home / Self Care | Payer: Medicare HMO | Attending: Family Medicine | Admitting: Family Medicine

## 2022-11-03 DIAGNOSIS — U071 COVID-19: Secondary | ICD-10-CM | POA: Diagnosis not present

## 2022-11-03 LAB — POC SARS CORONAVIRUS 2 AG -  ED: SARS Coronavirus 2 Ag: POSITIVE — AB

## 2022-11-03 MED ORDER — PAXLOVID (300/100) 20 X 150 MG & 10 X 100MG PO TBPK: ORAL_TABLET | ORAL | 0 refills | Status: DC

## 2022-11-03 MED ORDER — ACETAMINOPHEN 325 MG PO TABS
650.0000 mg | ORAL_TABLET | Freq: Once | ORAL | Status: AC
  Administered 2022-11-03: 650 mg via ORAL

## 2022-11-03 NOTE — ED Provider Notes (Signed)
[facility]    CSN: [id number] Arrival date & time: 11/03/22  1621      History   Chief Complaint Chief Complaint  Patient presents with   Fever   Nasal Congestion    HPI Amanda Novak is a 67 y.o. female.   HPI  Patient is here for nasal congestion, ear pressure and pain, sinus pressure, fever.  Temperature on arrival is 102.3.  She is not COVID vaccinated.  She has been around a lot of people.  No known exposure to COVID or flu  Past Medical History:  Diagnosis Date   Fibromyalgia    GERD (gastroesophageal reflux disease)    Hyperlipidemia 10/09/2015   Lactose intolerance    Nipple discharge     Patient Active Problem List   Diagnosis Date Noted   Elevated blood pressure reading 10/27/2022   Right ankle injury 10/22/2021   Mid back pain 08/23/2021   Suspicious nevus 08/23/2021   COVID-19 03/08/2021   Acute non-recurrent maxillary sinusitis 01/28/2021   Trochanteric bursitis, right hip 11/07/2019   Elevated serum creatinine 09/26/2019   Numbness and tingling in right hand 12/07/2018   Carpal tunnel syndrome on left 08/10/2018   Primary osteoarthritis of left wrist 06/17/2018   Neck pain 02/17/2018   DDD (degenerative disc disease), cervical 02/17/2018   Family history of rheumatoid arthritis 01/31/2018   Bilateral hand pain 01/29/2018   Gallbladder polyp 10/07/2017   Trigger thumb, right thumb 12/03/2016   Hemangioma of bone 11/15/2015   GERD (gastroesophageal reflux disease) 11/15/2015   NASH (nonalcoholic steatohepatitis) 11/15/2015   Vitamin B 12 deficiency 10/10/2015   Prediabetes 10/10/2015   Hyperlipidemia 10/09/2015   Vitamin D deficiency 10/09/2015   Memory loss 10/09/2015    Past Surgical History:  Procedure Laterality Date   BREAST SURGERY     tubal lig      OB History   No obstetric history on file.      Home Medications    Prior to Admission medications   Medication Sig Start Date End Date Taking? Authorizing  Provider  nirmatrelvir & ritonavir (PAXLOVID, 300/100,) 20 x 150 MG & 10 x 100MG TBPK Take as directed 11/03/22  Yes Raylene Everts, MD  acetaminophen (TYLENOL) 500 MG tablet Take 1,000 mg by mouth every 6 (six) hours as needed.    [provider]  Ascorbic Acid (VITAMIN C) 1000 MG tablet Take 1,000 mg by mouth daily.    [provider]  atorvastatin (LIPITOR) 20 MG tablet TAKE 1 TABLET(20 MG) BY MOUTH DAILY 10/02/22   Breeback, [name] L, PA-C  BIOTIN PO Take 1 tablet by mouth daily. 5054mg daily    [provider]  cholecalciferol (VITAMIN D) 1000 units tablet Take 2,000 Units by mouth daily.    [provider]  Cyanocobalamin (B-12) 500 MCG TABS Take 500 mcg by mouth daily. Please follow-up with [name] to recheck B12 levels 6-8 weeks after starting oral medications 06/24/17   Breeback, [name] L, PA-C  pantoprazole (PROTONIX) 40 MG tablet TAKE 1 TABLET(40 MG) BY MOUTH TWICE DAILY BEFORE A MEAL 05/10/19   Breeback, [name] L, PA-C  Zinc 50 MG CAPS Take 1 capsule by mouth daily.    [provider]    Family History Family History  Problem Relation Age of Onset   Alzheimer's disease Mother    Rheum arthritis Mother     Social History Social History   Tobacco Use   Smoking status: Never   Smokeless tobacco: Never  Allergies   Tramadol, Codeine, Hydrocodone, and Zyrtec [cetirizine]   Review of Systems Review of Systems See HPI  Physical Exam Triage Vital Signs ED Triage Vitals  Enc Vitals Group     BP 11/03/22 1721 (!) 151/74     Pulse Rate 11/03/22 1721 87     Resp 11/03/22 1721 16     Temp 11/03/22 1721 (!) 102.3 F (39.1 C)     Temp Source 11/03/22 1721 Oral     SpO2 11/03/22 1721 98 %     Weight --      Height --      Head Circumference --      Peak Flow --      Pain Score 11/03/22 1722 0     Pain Loc --      Pain Edu? --      Excl. in [place]? --    No data found.  Updated Vital Signs BP (!) 151/74 (BP Location: Right Arm)    Pulse 87   Temp (!) 102.3 F (39.1 C) (Oral)   Resp 16   SpO2 98%      Physical Exam Constitutional:      General: She is not in acute distress.    Appearance: She is well-developed. She is ill-appearing.  HENT:     Head: Normocephalic and atraumatic.     Right Ear: Tympanic membrane and ear canal normal.     Left Ear: Tympanic membrane and ear canal normal.     Nose: Nose normal. No rhinorrhea.     Mouth/Throat:     Pharynx: No posterior oropharyngeal erythema.  Eyes:     Conjunctiva/sclera: Conjunctivae normal.     Pupils: Pupils are equal, round, and reactive to light.  Cardiovascular:     Rate and Rhythm: Regular rhythm. Tachycardia present.     Heart sounds: Murmur heard.  Pulmonary:     Effort: Pulmonary effort is normal. No respiratory distress.     Breath sounds: Normal breath sounds.  Abdominal:     General: There is no distension.     Palpations: Abdomen is soft.  Musculoskeletal:        General: Normal range of motion.     Cervical back: Normal range of motion and neck supple.  Lymphadenopathy:     Cervical: No cervical adenopathy.  Skin:    General: Skin is warm and dry.  Neurological:     Mental Status: She is alert.      UC Treatments / Results  Labs (all labs ordered are listed, but only abnormal results are displayed) Labs Reviewed  POC SARS CORONAVIRUS 2 AG -  ED - Abnormal; Notable for the following components:      Result Value   SARS Coronavirus 2 Ag Positive (*)    All other components within normal limits    EKG   Radiology No results found.  Procedures Procedures (including critical care time)  Medications Ordered in UC Medications  acetaminophen (TYLENOL) tablet 650 mg (650 mg Oral Given 11/03/22 1734)    Initial Impression / Assessment and Plan / UC Course  I have reviewed the triage vital signs and the nursing notes.  Pertinent labs & imaging results that were available during my care of the patient were reviewed by me  and considered in my medical decision making (see chart for details).     Final Clinical Impressions(s) / UC Diagnoses   Final diagnoses:  [id number]     Discharge Instructions  Take paxlovid 2 x a day Drink lots of water May take OTC cough and cold medicine as needed Take ibuprofen or tylenol for pain and fever CALL for problems You must quarantine for 5 days, until Friday On Saturday may go out but wear mask another 5 days   ED Prescriptions     Medication Sig Dispense Auth. Provider   nirmatrelvir & ritonavir (PAXLOVID, 300/100,) 20 x 150 MG & 10 x 100MG TBPK Take as directed 1 each Meda Coffee Jennette Banker, MD      PDMP not reviewed this encounter.   Raylene Everts, MD 11/03/22 Vernelle Emerald

## 2022-11-03 NOTE — ED Triage Notes (Signed)
Pt c/o fever and nasal congestion since last night. Cough last night. Fever was 100.4 this am. Tylenol prn.

## 2022-11-03 NOTE — Discharge Instructions (Addendum)
Take paxlovid 2 x a day Drink lots of water May take OTC cough and cold medicine as needed Take ibuprofen or tylenol for pain and fever CALL for problems You must quarantine for 5 days, until Friday On Saturday may go out but wear mask another 5 days

## 2022-11-21 ENCOUNTER — Encounter: Payer: Self-pay | Admitting: Physician Assistant

## 2022-12-23 ENCOUNTER — Ambulatory Visit (INDEPENDENT_AMBULATORY_CARE_PROVIDER_SITE_OTHER): Payer: Medicare HMO | Admitting: Sports Medicine

## 2022-12-23 DIAGNOSIS — M79671 Pain in right foot: Secondary | ICD-10-CM

## 2022-12-23 DIAGNOSIS — G8929 Other chronic pain: Secondary | ICD-10-CM

## 2022-12-23 DIAGNOSIS — M84374A Stress fracture, right foot, initial encounter for fracture: Secondary | ICD-10-CM

## 2022-12-23 NOTE — Assessment & Plan Note (Addendum)
Pleasant 67 year old female, she has had over a year of right foot pain, intermittent swelling, never follow-up after her last visit with me in February 2023. X-rays have historically been negative, she has swelling and pain dorsal midfoot second through fourth metatarsal shafts, suspect metatarsal stress fracture, considering failure of conservative treatment we will proceed with MRI of the right foot, cam boot immobilization, return to see me for MRI results.  Update: Patient calls back, she would like [place] to add the MRI of the heel as well, she does have chronic heel pain, same as above in terms of duration and conservative treatment.

## 2022-12-23 NOTE — Progress Notes (Addendum)
    Procedures performed today:    None.  Independent interpretation of notes and tests performed by another provider:   None.  Brief History, Exam, Impression, and Recommendations:    Chronic foot pain, right Pleasant 67 year old female, she has had over a year of right foot pain, intermittent swelling, never follow-up after her last visit with me in February 2023. X-rays have historically been negative, she has swelling and pain dorsal midfoot second through fourth metatarsal shafts, suspect metatarsal stress fracture, considering failure of conservative treatment we will proceed with MRI of the right foot, cam boot immobilization, return to see me for MRI results.  Update: Patient calls back, she would like [place] to add the MRI of the heel as well, she does have chronic heel pain, same as above in terms of duration and conservative treatment.    ____________________________________________ Ihor Austin. Benjamin Stain, M.D., ABFM., CAQSM., AME. Primary Care and Sports Medicine [facility]  Adjunct Professor of Family Medicine  [employer] [facility] [employer]  Restaurant manager, fast food

## 2022-12-27 ENCOUNTER — Ambulatory Visit (INDEPENDENT_AMBULATORY_CARE_PROVIDER_SITE_OTHER): Payer: Medicare HMO

## 2022-12-27 DIAGNOSIS — M79671 Pain in right foot: Secondary | ICD-10-CM | POA: Diagnosis not present

## 2022-12-27 DIAGNOSIS — M84374A Stress fracture, right foot, initial encounter for fracture: Secondary | ICD-10-CM | POA: Diagnosis not present

## 2022-12-28 ENCOUNTER — Other Ambulatory Visit: Payer: Medicare HMO

## 2023-01-05 NOTE — Addendum Note (Signed)
Addended by: Monica Becton on: 01/05/2023 04:58 PM   Modules accepted: Orders

## 2023-01-09 ENCOUNTER — Telehealth: Payer: Self-pay

## 2023-01-09 NOTE — Telephone Encounter (Signed)
Patient left VM and states insurance isnt covering her mri for the heel and was wondering what the next steps is.

## 2023-01-09 NOTE — Telephone Encounter (Signed)
Any ideas Vanicia?

## 2023-01-09 NOTE — Telephone Encounter (Signed)
I'm not sure where the patient got this information. Her MRI has been authorized by her [facility].   Auth obtained, ref # [id number]. Valid from 01/08/23 through 02/07/23. Imaging dept notified.

## 2023-01-11 NOTE — Telephone Encounter (Signed)
Sigh...patients...  I'll CC this to [facility] for scheduling.

## 2023-01-17 ENCOUNTER — Ambulatory Visit (INDEPENDENT_AMBULATORY_CARE_PROVIDER_SITE_OTHER): Payer: Medicare HMO

## 2023-01-17 DIAGNOSIS — M79671 Pain in right foot: Secondary | ICD-10-CM | POA: Diagnosis not present

## 2023-01-17 DIAGNOSIS — G8929 Other chronic pain: Secondary | ICD-10-CM

## 2023-01-17 DIAGNOSIS — M7989 Other specified soft tissue disorders: Secondary | ICD-10-CM | POA: Diagnosis not present

## 2023-01-30 ENCOUNTER — Other Ambulatory Visit (INDEPENDENT_AMBULATORY_CARE_PROVIDER_SITE_OTHER): Payer: Medicare HMO

## 2023-01-30 ENCOUNTER — Ambulatory Visit (INDEPENDENT_AMBULATORY_CARE_PROVIDER_SITE_OTHER): Payer: Medicare HMO | Admitting: Sports Medicine

## 2023-01-30 DIAGNOSIS — G8929 Other chronic pain: Secondary | ICD-10-CM | POA: Diagnosis not present

## 2023-01-30 DIAGNOSIS — M79671 Pain in right foot: Secondary | ICD-10-CM

## 2023-01-30 NOTE — Progress Notes (Signed)
    Procedures performed today:    Procedure: Real-time Ultrasound Guided injection of the right ankle Device: Samsung HS60  Verbal informed consent obtained.  Time-out conducted.  Noted no overlying erythema, induration, or other signs of local infection.  Skin prepped in a sterile fashion.  Local anesthesia: Topical Ethyl chloride.  With sterile technique and under real time ultrasound guidance: Mild degenerative changes noted, 1 cc Kenalog 40, 1 cc lidocaine,1 cc bupivacaine injected easily Completed without difficulty  Advised to call if fevers/chills, erythema, induration, drainage, or persistent bleeding.  Images permanently stored and available for review in PACS.  Impression: Technically successful ultrasound guided injection.  Independent interpretation of notes and tests performed by another provider:   None.  Brief History, Exam, Impression, and Recommendations:    Chronic foot pain, right Amanda Novak is a very pleasant 67 year old female, she has had over a year of right foot pain, intermittent swelling. We had initial evaluation in February 2023 with loss to followup. Historically x-rays have been negative, pain is dorsal midfoot as well as around the tibiotalar joint anteriorly and posteriorly. We did cam boot immobilization due to concern for stress injury. After failure of conservative treatment we proceeded with MRI of the right foot and heel. Foot MRI was for the most part unrevealing but the hindfoot MRI did come back with multiple pathologic findings including tibiotalar osteoarthritis, tibialis posterior and peroneal sheath effusions, plantar fasciitis, and Achilles tendinosis/retrocalcaneal bursitis. Today she does have mild swelling over the ankle, pes cavus. Today dominant pain generator seems to be the tibiotalar joint with pain medially and laterally over the mortise. We did a tibiotalar joint injection today for diagnostic and therapeutic purposes. I would  also like her to get a second opinion from podiatry to ensure that we are following the appropriate treatment approach and potentially to discuss custom orthotics as well.    ____________________________________________ Ihor Austin. Benjamin Stain, M.D., ABFM., CAQSM., AME. Primary Care and Sports Medicine [facility]  Adjunct Professor of Family Medicine  [employer] [facility] [employer]  Restaurant manager, fast food

## 2023-01-30 NOTE — Assessment & Plan Note (Signed)
Amanda Novak is a very pleasant 67 year old female, she has had over a year of right foot pain, intermittent swelling. We had initial evaluation in February 2023 with loss to followup. Historically x-rays have been negative, pain is dorsal midfoot as well as around the tibiotalar joint anteriorly and posteriorly. We did cam boot immobilization due to concern for stress injury. After failure of conservative treatment we proceeded with MRI of the right foot and heel. Foot MRI was for the most part unrevealing but the hindfoot MRI did come back with multiple pathologic findings including tibiotalar osteoarthritis, tibialis posterior and peroneal sheath effusions, plantar fasciitis, and Achilles tendinosis/retrocalcaneal bursitis. Today she does have mild swelling over the ankle, pes cavus. Today dominant pain generator seems to be the tibiotalar joint with pain medially and laterally over the mortise. We did a tibiotalar joint injection today for diagnostic and therapeutic purposes. I would also like her to get a second opinion from podiatry to ensure that we are following the appropriate treatment approach and potentially to discuss custom orthotics as well.

## 2023-02-04 ENCOUNTER — Other Ambulatory Visit: Payer: Medicare HMO

## 2023-02-04 ENCOUNTER — Ambulatory Visit: Payer: Medicare HMO

## 2023-02-06 ENCOUNTER — Telehealth: Payer: Self-pay | Admitting: Physician Assistant

## 2023-02-06 NOTE — Telephone Encounter (Signed)
Orders printed and faxed to [facility].

## 2023-02-06 NOTE — Telephone Encounter (Signed)
Patient called and asked if her in reference to her Bone Density and Mammogram can be referred to [facility] because of her work schedule they only Do them on Wednesday's in the Afternoon at our facility please advise the order can be faxed to  [phone]

## 2023-02-16 DIAGNOSIS — J329 Chronic sinusitis, unspecified: Secondary | ICD-10-CM | POA: Diagnosis not present

## 2023-02-24 DIAGNOSIS — H524 Presbyopia: Secondary | ICD-10-CM | POA: Diagnosis not present

## 2023-02-24 DIAGNOSIS — H2513 Age-related nuclear cataract, bilateral: Secondary | ICD-10-CM | POA: Diagnosis not present

## 2023-03-09 DIAGNOSIS — Z1231 Encounter for screening mammogram for malignant neoplasm of breast: Secondary | ICD-10-CM | POA: Diagnosis not present

## 2023-03-09 DIAGNOSIS — Z13820 Encounter for screening for osteoporosis: Secondary | ICD-10-CM | POA: Diagnosis not present

## 2023-03-09 DIAGNOSIS — R92323 Mammographic fibroglandular density, bilateral breasts: Secondary | ICD-10-CM | POA: Diagnosis not present

## 2023-03-09 DIAGNOSIS — R928 Other abnormal and inconclusive findings on diagnostic imaging of breast: Secondary | ICD-10-CM | POA: Diagnosis not present

## 2023-03-09 DIAGNOSIS — Z780 Asymptomatic menopausal state: Secondary | ICD-10-CM | POA: Diagnosis not present

## 2023-03-09 LAB — HM DEXA SCAN: HM Dexa Scan: NORMAL

## 2023-03-09 LAB — HM MAMMOGRAPHY

## 2023-03-11 ENCOUNTER — Telehealth: Payer: Self-pay

## 2023-03-13 ENCOUNTER — Ambulatory Visit: Payer: Medicare HMO | Admitting: Sports Medicine

## 2023-03-18 ENCOUNTER — Encounter: Payer: Self-pay | Admitting: Physician Assistant

## 2023-03-19 ENCOUNTER — Ambulatory Visit: Payer: Medicare HMO | Admitting: Podiatry

## 2023-03-19 ENCOUNTER — Encounter: Payer: Self-pay | Admitting: Podiatry

## 2023-03-19 DIAGNOSIS — M19071 Primary osteoarthritis, right ankle and foot: Secondary | ICD-10-CM

## 2023-03-19 DIAGNOSIS — G5761 Lesion of plantar nerve, right lower limb: Secondary | ICD-10-CM

## 2023-03-19 DIAGNOSIS — M7661 Achilles tendinitis, right leg: Secondary | ICD-10-CM | POA: Diagnosis not present

## 2023-03-19 NOTE — Progress Notes (Signed)
  Subjective:  Patient ID: Amanda Novak, female    DOB: 1956-01-25,   MRN: [id number]  Chief Complaint  Patient presents with   Consult    Patient wants a consult / second opinion about her right foot pain    67 y.o. female presents for concern of right foot pain that has been going on for about a year. Relates pain in the back of her ankle and the front of her ankle as well as the ball of her foot relates numbness. She has been in the care of Dr. Karie Schwalbe and had MRIs that revealed ankle arthritis and tendonitis. Referred here for second opinion and possible orthotics. Patient relates some numbness in the ball of the left foot as well. Most recently had an injection to the right ankle and relates that this did improve the pain. Relates she did have an injury were she twisted the ankle it sounds like about a year ago. Also relates she had an ankle fracture years ago and believes it was the right ankle . Denies any other pedal complaints. Denies n/v/f/c.   Past Medical History:  Diagnosis Date   Fibromyalgia    GERD (gastroesophageal reflux disease)    Hyperlipidemia 10/09/2015   Lactose intolerance    Nipple discharge     Objective:  Physical Exam: Vascular: DP/PT pulses 2/4 bilateral. CFT <3 seconds. Normal hair growth on digits. No edema.  Skin. No lacerations or abrasions bilateral feet.  Musculoskeletal: MMT 5/5 bilateral lower extremities in DF, PF, Inversion and Eversion. Deceased ROM in DF of ankle joint. Tender to achilles tendon insertion and proximal to watershed area. Some pain to anterior ankle joint. Pain with metatarsal squeeze and palpation of third and fourth interspaces. Neurological: Sensation intact to light touch.   MRI right heel   IMPRESSION: Mild distal Achilles peritendinitis. No Achilles tendon tear. No retrocalcaneal bursitis.   Mild plantar fasciitis involving the proximal central bundle.   Mild tenosynovitis of the peroneal tendons above the  lateral malleolus. No evidence of tendon tear.   Mild tibiotalar osteoarthritis. No acute osseous abnormality or osteochondral defect.   Soft tissue swelling of the ankle most prominent medially.   IMPRESSION: Clawtoe deformities of the second through fifth toes. The exam is otherwise negative. No stress reaction or fracture is identified. Assessment:   1. Arthritis of ankle, right   2. Tendonitis, Achilles, right   3. Morton neuroma, right      Plan:  Patient was evaluated and treated and all questions answered. -Xrays reviewed and MRI reviewed.  -Discussed ankle arthritis , neuroma and Achilles insertional tendonitis and treatment options with patient.  -Discussed stretching exercises. -Discussed holding off on injection today as doing ok and returning in future for another injection.  Discussed padding and offloading today.  Discussed given all of her findings she would likely find the most benefit from custom orthotics to prevent pain and future pain with ambulation. Will try to get prior authorization for this.  Patient to return in 6 weeks or sooner if concerns arise.     Louann Sjogren, DPM

## 2023-03-24 NOTE — Telephone Encounter (Signed)
Pt aware that additional imaging require. Pt stated she had appt set for 04/08/2023

## 2023-03-27 ENCOUNTER — Ambulatory Visit: Payer: Medicare HMO | Admitting: Physician Assistant

## 2023-04-01 ENCOUNTER — Ambulatory Visit: Payer: Medicare HMO | Admitting: Physician Assistant

## 2023-04-08 DIAGNOSIS — N6011 Diffuse cystic mastopathy of right breast: Secondary | ICD-10-CM | POA: Diagnosis not present

## 2023-04-08 DIAGNOSIS — R928 Other abnormal and inconclusive findings on diagnostic imaging of breast: Secondary | ICD-10-CM | POA: Diagnosis not present

## 2023-04-08 DIAGNOSIS — R92323 Mammographic fibroglandular density, bilateral breasts: Secondary | ICD-10-CM | POA: Diagnosis not present

## 2023-04-08 DIAGNOSIS — N6320 Unspecified lump in the left breast, unspecified quadrant: Secondary | ICD-10-CM | POA: Diagnosis not present

## 2023-04-17 ENCOUNTER — Encounter: Payer: Self-pay | Admitting: Physician Assistant

## 2023-04-17 DIAGNOSIS — N6009 Solitary cyst of unspecified breast: Secondary | ICD-10-CM | POA: Insufficient documentation

## 2023-09-21 ENCOUNTER — Other Ambulatory Visit: Payer: Self-pay | Admitting: Physician Assistant

## 2023-09-21 DIAGNOSIS — Z13820 Encounter for screening for osteoporosis: Secondary | ICD-10-CM

## 2023-10-05 DIAGNOSIS — R928 Other abnormal and inconclusive findings on diagnostic imaging of breast: Secondary | ICD-10-CM | POA: Diagnosis not present

## 2023-10-05 DIAGNOSIS — N6002 Solitary cyst of left breast: Secondary | ICD-10-CM | POA: Diagnosis not present

## 2023-10-05 DIAGNOSIS — N6001 Solitary cyst of right breast: Secondary | ICD-10-CM | POA: Diagnosis not present

## 2023-10-06 ENCOUNTER — Encounter: Payer: Self-pay | Admitting: Physician Assistant

## 2023-10-20 ENCOUNTER — Ambulatory Visit: Payer: Medicare (Managed Care)

## 2023-10-20 ENCOUNTER — Ambulatory Visit (INDEPENDENT_AMBULATORY_CARE_PROVIDER_SITE_OTHER): Payer: Medicare (Managed Care) | Admitting: Sports Medicine

## 2023-10-20 DIAGNOSIS — M1612 Unilateral primary osteoarthritis, left hip: Secondary | ICD-10-CM | POA: Diagnosis not present

## 2023-10-20 DIAGNOSIS — G5601 Carpal tunnel syndrome, right upper limb: Secondary | ICD-10-CM | POA: Diagnosis not present

## 2023-10-20 DIAGNOSIS — G8929 Other chronic pain: Secondary | ICD-10-CM

## 2023-10-20 DIAGNOSIS — M25552 Pain in left hip: Secondary | ICD-10-CM | POA: Diagnosis not present

## 2023-10-20 DIAGNOSIS — Z23 Encounter for immunization: Secondary | ICD-10-CM

## 2023-10-20 MED ORDER — CELECOXIB 200 MG PO CAPS: ORAL_CAPSULE | ORAL | 2 refills | Status: DC

## 2023-10-20 NOTE — Progress Notes (Addendum)
    Procedures performed today:    None.  Independent interpretation of notes and tests performed by another provider:   None.  Brief History, Exam, Impression, and Recommendations:    Carpal tunnel syndrome on right, status post left carpal tunnel release Bilateral carpal tunnel syndrome status post left carpal tunnel release, night splinting is moderately effective, we will add some physical therapy and if insufficient improvement after 4 to 6 weeks we will do a median nerve hydrodissection on the right.  Chronic left hip pain Complex history left hip, eccentric she has a history of a bony hemangioma status post surgical removal in the distant past, she also has some left hip osteoarthritis. Now having worsening of pain left hip, on exam she has loss of internal rotation on the left and reproduction of pain in the groin. I think we are dealing with mostly hip osteoarthritis, we will have her do some physical therapy, she will continue Tylenol and we can add Celebrex as she does have a history of gastritis. Considering her history of a hemangioma as well as prior abnormalities on hip x-rays we will proceed with femur MRI with and without contrast as well. Return in 6 weeks, left hip joint injection if not better.  Update: MRI does show a large soft tissue hemangioma, this is a tangle of blood vessels, it does seem to be eroding to some degree into the femur. As this is well outside of my specialty I would like consultation with orthopedic oncology, however patient needs to understand this is benign, not cancerous.  It would still be reasonable to try hip joint injection if she still has pain at the follow-up as this will address what ever component of pain is coming from her hip joint.    ____________________________________________ Ihor Austin. Benjamin Stain, M.D., ABFM., CAQSM., AME. Primary Care and Sports Medicine [facility]  Adjunct Professor of Family  Medicine  [employer] [facility] [employer]  Restaurant manager, fast food

## 2023-10-20 NOTE — Addendum Note (Signed)
Addended by: Carren Rang A on: 10/20/2023 03:45 PM   Modules accepted: Orders

## 2023-10-20 NOTE — Assessment & Plan Note (Addendum)
 Complex history left hip, eccentric she has a history of a bony hemangioma status post surgical removal in the distant past, she also has some left hip osteoarthritis. Now having worsening of pain left hip, on exam she has loss of internal rotation on the left and reproduction of pain in the groin. I think we are dealing with mostly hip osteoarthritis, we will have her do some physical therapy, she will continue Tylenol and we can add Celebrex as she does have a history of gastritis. Considering her history of a hemangioma as well as prior abnormalities on hip x-rays we will proceed with femur MRI with and without contrast as well. Return in 6 weeks, left hip joint injection if not better.  Update: MRI does show a large soft tissue hemangioma, this is a tangle of blood vessels, it does seem to be eroding to some degree into the femur. As this is well outside of my specialty I would like consultation with orthopedic oncology, however patient needs to understand this is benign, not cancerous.  It would still be reasonable to try hip joint injection if she still has pain at the follow-up as this will address what ever component of pain is coming from her hip joint.

## 2023-10-20 NOTE — Assessment & Plan Note (Signed)
Bilateral carpal tunnel syndrome status post left carpal tunnel release, night splinting is moderately effective, we will add some physical therapy and if insufficient improvement after 4 to 6 weeks we will do a median nerve hydrodissection on the right.

## 2023-10-21 ENCOUNTER — Telehealth: Payer: Self-pay | Admitting: Physician Assistant

## 2023-10-21 LAB — BASIC METABOLIC PANEL
BUN/Creatinine Ratio: 14 (ref 12–28)
BUN: 13 mg/dL (ref 8–27)
CO2: 23 mmol/L (ref 20–29)
Calcium: 9.6 mg/dL (ref 8.7–10.3)
Chloride: 105 mmol/L (ref 96–106)
Creatinine, Ser: 0.96 mg/dL (ref 0.57–1.00)
Glucose: 141 mg/dL — ABNORMAL HIGH (ref 70–99)
Potassium: 4.1 mmol/L (ref 3.5–5.2)
Sodium: 143 mmol/L (ref 134–144)
eGFR: 65 mL/min/{1.73_m2} (ref >59)

## 2023-10-21 NOTE — Telephone Encounter (Signed)
Pt requests PT referral to be sent to [facility].    Referral, clinical notes, demographics and copies of insurance cards have been faxed to [facility] at [phone]. Office will contact patient to schedule referral appointment.

## 2023-10-21 NOTE — Telephone Encounter (Signed)
Copied from CRM [id number]. Topic: Referral - Question >> Oct 21, 2023 11:25 AM Nila Nephew wrote: Reason for CRM: Patient would like to change location of referral for physical therapy. Patient works from 9 to 5 and wants specifically a location that has appointments available after 5 PM on any given weekday.  Patient would like to go to [facility] - [address], [place] [address]

## 2023-10-26 ENCOUNTER — Other Ambulatory Visit: Payer: Self-pay | Admitting: Physician Assistant

## 2023-10-26 DIAGNOSIS — G8929 Other chronic pain: Secondary | ICD-10-CM

## 2023-10-26 MED ORDER — CELECOXIB 200 MG PO CAPS: ORAL_CAPSULE | ORAL | 2 refills | Status: DC

## 2023-10-30 ENCOUNTER — Telehealth: Payer: Self-pay

## 2023-10-30 NOTE — Telephone Encounter (Signed)
 Copied from CRM [id number]. Topic: Referral - Status >> Oct 30, 2023 10:39 AM Susanna ORN wrote: Reason for CRM: Patient calling to see if referral to [facility] was sent. States she never heard back from anyone. Per notes, advised pt that the referral, clinical notes, demographics and copies of insurance cards have been faxed to them. Advised that the office will contact pt to schedule referral appt. Patient stated maybe she will give them a call.

## 2023-11-02 ENCOUNTER — Ambulatory Visit: Payer: Medicare (Managed Care)

## 2023-11-02 DIAGNOSIS — G8929 Other chronic pain: Secondary | ICD-10-CM | POA: Diagnosis not present

## 2023-11-02 DIAGNOSIS — M25552 Pain in left hip: Secondary | ICD-10-CM

## 2023-11-02 MED ORDER — GADOBUTROL 1 MMOL/ML IV SOLN
7.5000 mL | Freq: Once | INTRAVENOUS | Status: AC | PRN
  Administered 2023-11-02: 7.5 mL via INTRAVENOUS

## 2023-11-18 ENCOUNTER — Ambulatory Visit (INDEPENDENT_AMBULATORY_CARE_PROVIDER_SITE_OTHER): Payer: Medicare (Managed Care) | Admitting: Physician Assistant

## 2023-11-18 ENCOUNTER — Ambulatory Visit: Payer: Self-pay | Admitting: Physician Assistant

## 2023-11-18 ENCOUNTER — Encounter: Payer: Self-pay | Admitting: Physician Assistant

## 2023-11-18 VITALS — BP 138/81 | HR 55

## 2023-11-18 DIAGNOSIS — R7303 Prediabetes: Secondary | ICD-10-CM | POA: Diagnosis not present

## 2023-11-18 DIAGNOSIS — Z0000 Encounter for general adult medical examination without abnormal findings: Secondary | ICD-10-CM

## 2023-11-18 DIAGNOSIS — E538 Deficiency of other specified B group vitamins: Secondary | ICD-10-CM

## 2023-11-18 DIAGNOSIS — E559 Vitamin D deficiency, unspecified: Secondary | ICD-10-CM | POA: Diagnosis not present

## 2023-11-18 DIAGNOSIS — E782 Mixed hyperlipidemia: Secondary | ICD-10-CM

## 2023-11-18 NOTE — Telephone Encounter (Signed)
 Patient called in regarding results from a recent MRI. Because the patient was inquiring about imaging results, this RN called the CAL. CAL instructed this RN to tell the patient that a nurse would call her back in 5 minutes to discuss results.  Copied from CRM [id number]. Topic: Clinical - Lab/Test Results >> Nov 18, 2023 10:32 AM Priscille Loveless wrote: Reason for CRM:Pt is returning call in regards to test results Reason for Disposition  [1] Caller requesting NON-URGENT health information AND [2] PCP's office is the best resource  Protocols used: Information Only Call - No Triage-A-AH

## 2023-11-18 NOTE — Patient Instructions (Addendum)
 Seborrheic Keratosis A seborrheic keratosis is a common, noncancerous (benign) skin growth. These growths are velvety, waxy, or rough spots that appear on the skin. They are often tan, brown, or black. The skin growths can be flat or raised and may be scaly. What are the causes? The cause of this condition is not known. What increases the risk? You are more likely to develop this condition if you: Have a family history of seborrheic keratosis. Are 72 years old or older. Are pregnant. Have had estrogen replacement therapy. What are the signs or symptoms? Symptoms of this condition include growths on the face, chest, shoulders, back, or other areas. These growths: Are usually painless, but may become irritated and itchy. Can be tan, yellow, brown, black, or other colors. Are slightly raised or have a flat surface. Are sometimes rough or wart-like in texture. Are often velvety or waxy on the surface. Are round or oval-shaped. Often occur in groups, but may occur as a single growth. How is this diagnosed? This condition is diagnosed with a medical history and physical exam. A sample of the growth may be tested (skin biopsy). You may also need to see a skin specialist (dermatologist). How is this treated? Treatment is not usually needed for this condition unless the growths are irritated or bleed often. You may also choose to have the growths removed if you do not like their appearance. Growth removal may include a procedure in which: Liquid nitrogen is applied to "freeze" off the growth (cryosurgery). This is the most common procedure. The growth is burned off with electricity (electrocautery). The growth is removed by scraping (curettage). Follow these instructions at home: Watch your growth or growths for any changes. Do not scratch or pick at the growth or growths. This can cause them to become irritated or infected. Contact a health care provider if: You suddenly have many new  growths. Your growth bleeds, itches, or hurts. Your growth suddenly becomes larger or changes color. Summary A seborrheic keratosis is a common, noncancerous skin growth. Treatment is not usually needed for this condition unless the growths are irritated or bleed often. Watch your growth or growths for any changes. Contact a health care provider if you suddenly have many new growths or your growth suddenly becomes larger or changes color. This information is not intended to replace advice given to you by your health care provider. Make sure you discuss any questions you have with your health care provider. Document Revised: 11/22/2021 Document Reviewed: 11/22/2021 Elsevier Patient Education  2024 Elsevier Inc.Cherry Angioma A cherry angioma, also called a Ladean Raya spot, is a harmless growth on the skin. It is made up of blood vessels. Cherry angiomas can appear anywhere on the body, but they usually appear on the trunk and arms. What are the causes? The cause of this condition is not known, but it seems to be related to advancing age. What increases the risk? You are more likely to develop this condition if: You are over the age of 74. You have a family member with this condition. What are the signs or symptoms? Symptoms of this condition include harmless growths that are: Smooth, round, and red or purplish-red. As small as the tip of a pin or as big as a pencil eraser. How is this diagnosed? This condition is diagnosed with a skin exam. Rarely, a piece of the cherry angioma may be removed for testing if it is not clear that the growth is a cherry angioma. How is this  treated? Treatment is not needed for this condition. If you do not like the way a cherry angioma looks, you may have it removed. Removal methods include: A method where heat is used to burn the cherry angioma off the skin (electrocautery). A method where the cherry angioma is frozen (cryosurgery). This causes it to  eventually fall off the skin. A method where a laser is used to destroy the red blood cells and blood vessels in the angioma (laser therapy). A minor surgical procedure. A scalpel is used to remove the cherry angioma off the skin. A cherry angioma may come back after it has been removed. Follow these instructions at home: If you have a cherry angioma removed, keep the area clean and follow any other care instructions as told by your health care provider. Take over-the-counter and prescription medicines only as told by your health care provider. Keep all follow-up visits. This is important. Summary A cherry angioma is a harmless growth on the skin that is made up of blood vessels. Treatment is not needed for this condition. If you do not like the way a cherry angioma looks, you may have it removed. If you have a cherry angioma removed, follow any care instructions as told by your health care provider. This information is not intended to replace advice given to you by your health care provider. Make sure you discuss any questions you have with your health care provider. Document Revised: 05/30/2021 Document Reviewed: 05/30/2021 Elsevier Patient Education  2024 Elsevier Inc.  Health Maintenance After Age 44 After age 78, you are at a higher risk for certain long-term diseases and infections as well as injuries from falls. Falls are a major cause of broken bones and head injuries in people who are older than age 24. Getting regular preventive care can help to keep you healthy and well. Preventive care includes getting regular testing and making lifestyle changes as recommended by your health care provider. Talk with your health care provider about: Which screenings and tests you should have. A screening is a test that checks for a disease when you have no symptoms. A diet and exercise plan that is right for you. What should I know about screenings and tests to prevent falls? Screening and testing  are the best ways to find a health problem early. Early diagnosis and treatment give you the best chance of managing medical conditions that are common after age 49. Certain conditions and lifestyle choices may make you more likely to have a fall. Your health care provider may recommend: Regular vision checks. Poor vision and conditions such as cataracts can make you more likely to have a fall. If you wear glasses, make sure to get your prescription updated if your vision changes. Medicine review. Work with your health care provider to regularly review all of the medicines you are taking, including over-the-counter medicines. Ask your health care provider about any side effects that may make you more likely to have a fall. Tell your health care provider if any medicines that you take make you feel dizzy or sleepy. Strength and balance checks. Your health care provider may recommend certain tests to check your strength and balance while standing, walking, or changing positions. Foot health exam. Foot pain and numbness, as well as not wearing proper footwear, can make you more likely to have a fall. Screenings, including: Osteoporosis screening. Osteoporosis is a condition that causes the bones to get weaker and break more easily. Blood pressure screening. Blood pressure changes  and medicines to control blood pressure can make you feel dizzy. Depression screening. You may be more likely to have a fall if you have a fear of falling, feel depressed, or feel unable to do activities that you used to do. Alcohol use screening. Using too much alcohol can affect your balance and may make you more likely to have a fall. Follow these instructions at home: Lifestyle Do not drink alcohol if: Your health care provider tells you not to drink. If you drink alcohol: Limit how much you have to: 0-1 drink a day for women. 0-2 drinks a day for men. Know how much alcohol is in your drink. In the [place] one drink equals  one 12 oz bottle of beer (355 mL), one 5 oz glass of wine (148 mL), or one 1 oz glass of hard liquor (44 mL). Do not use any products that contain nicotine or tobacco. These products include cigarettes, chewing tobacco, and vaping devices, such as e-cigarettes. If you need help quitting, ask your health care provider. Activity  Follow a regular exercise program to stay fit. This will help you maintain your balance. Ask your health care provider what types of exercise are appropriate for you. If you need a cane or walker, use it as recommended by your health care provider. Wear supportive shoes that have nonskid soles. Safety  Remove any tripping hazards, such as rugs, cords, and clutter. Install safety equipment such as grab bars in bathrooms and safety rails on stairs. Keep rooms and walkways well-lit. General instructions Talk with your health care provider about your risks for falling. Tell your health care provider if: You fall. Be sure to tell your health care provider about all falls, even ones that seem minor. You feel dizzy, tiredness (fatigue), or off-balance. Take over-the-counter and prescription medicines only as told by your health care provider. These include supplements. Eat a healthy diet and maintain a healthy weight. A healthy diet includes low-fat dairy products, low-fat (lean) meats, and fiber from whole grains, beans, and lots of fruits and vegetables. Stay current with your vaccines. Schedule regular health, dental, and eye exams. Summary Having a healthy lifestyle and getting preventive care can help to protect your health and wellness after age 58. Screening and testing are the best way to find a health problem early and help you avoid having a fall. Early diagnosis and treatment give you the best chance for managing medical conditions that are more common for people who are older than age 72. Falls are a major cause of broken bones and head injuries in people who are  older than age 77. Take precautions to prevent a fall at home. Work with your health care provider to learn what changes you can make to improve your health and wellness and to prevent falls. This information is not intended to replace advice given to you by your health care provider. Make sure you discuss any questions you have with your health care provider. Document Revised: 01/28/2021 Document Reviewed: 01/28/2021 Elsevier Patient Education  2024 [employer].

## 2023-11-18 NOTE — Progress Notes (Signed)
 Complete physical exam  Patient: Amanda Novak   DOB: 06-03-1956   68 y.o. Female  MRN: [id number]  Subjective:     Amanda Novak is a 68 y.o. female who presents today for a complete physical exam. She reports consuming a general diet. The patient does not participate in regular exercise at present. She did joint a fitness center with silver sneakers. She generally feels well. She reports sleeping fairly well. She does have additional problems to discuss today.   She is on Cefdinir and methylprednisolone. She went to [facility] this past weekend due to having flu sxs. Tested negative for covid and flu - they diagnosed her with bronchitis.   She states she has a growing skin tag in between her legs. It has been there for years and she thinks it will need to be surgically removed.   She also has some dark spots on her arms and back - consistent with SKs She also has some little red blood blisters on her bra line.    Most recent fall risk assessment:    11/18/2023    8:27 AM  Fall Risk   Falls in the past year? 0  Number falls in past yr: 0  Injury with Fall? 0  Risk for fall due to : No Fall Risks  Follow up Falls evaluation completed     Most recent depression screenings:    11/18/2023    8:28 AM 09/24/2022    7:46 AM  PHQ 2/9 Scores  PHQ - 2 Score 0 0    Vision:Within last year and Dental: No current dental problems and Receives regular dental care  Patient Active Problem List   Diagnosis Date Noted   Chronic left hip pain 10/20/2023   Breast cyst 04/17/2023   Chronic foot pain, right 12/23/2022   Elevated blood pressure reading 10/27/2022   Right ankle injury 10/22/2021   Mid back pain 08/23/2021   Suspicious nevus 08/23/2021   COVID-19 03/08/2021   Acute non-recurrent maxillary sinusitis 01/28/2021   Trochanteric bursitis, right hip 11/07/2019   Elevated serum creatinine 09/26/2019   Numbness and tingling in right hand 12/07/2018   Carpal tunnel syndrome on right,  status post left carpal tunnel release 08/10/2018   Primary osteoarthritis of left wrist 06/17/2018   Neck pain 02/17/2018   DDD (degenerative disc disease), cervical 02/17/2018   Family history of rheumatoid arthritis 01/31/2018   Bilateral hand pain 01/29/2018   Gallbladder polyp 10/07/2017   Trigger thumb, right thumb 12/03/2016   Hemangioma of bone 11/15/2015   GERD (gastroesophageal reflux disease) 11/15/2015   NASH (nonalcoholic steatohepatitis) 11/15/2015   Vitamin B 12 deficiency 10/10/2015   Prediabetes 10/10/2015   Hyperlipidemia 10/09/2015   Vitamin D deficiency 10/09/2015   Memory loss 10/09/2015   Past Medical History:  Diagnosis Date   Fibromyalgia    GERD (gastroesophageal reflux disease)    Hyperlipidemia 10/09/2015   Lactose intolerance    Nipple discharge    Past Surgical History:  Procedure Laterality Date   BREAST SURGERY  2010   Lumpectomy   CARPAL TUNNEL RELEASE Left 02/10/2020   HEMANGIOMA EXCISION     LIVER BIOPSY     TONSILECTOMY, ADENOIDECTOMY, BILATERAL MYRINGOTOMY AND TUBES     tubal lig     Social History   Tobacco Use   Smoking status: Never   Smokeless tobacco: Never   Family History  Problem Relation Age of Onset   Alzheimer's disease Mother    Rheum arthritis Mother  Allergies  Allergen Reactions   Tramadol Itching   Codeine Hives and Itching   Hydrocodone Hives and Itching   Zyrtec [Cetirizine] Other (See Comments)    Headache      Patient Care Team: Nolene Ebbs as PCP - General (Family Medicine)   Outpatient Medications Prior to Visit  Medication Sig   acetaminophen (TYLENOL) 500 MG tablet Take 1,000 mg by mouth every 6 (six) hours as needed.   Ascorbic Acid (VITAMIN C) 1000 MG tablet Take 1,000 mg by mouth daily.   atorvastatin (LIPITOR) 20 MG tablet TAKE 1 TABLET(20 MG) BY MOUTH DAILY   BIOTIN PO Take 1 tablet by mouth daily. daily   celecoxib (CELEBREX) 200 MG capsule One to 2 tablets by mouth  daily as needed for pain.   cholecalciferol (VITAMIN D) 1000 units tablet Take 2,000 Units by mouth daily.   Cyanocobalamin (B-12) 500 MCG TABS Take 500 mcg by mouth daily. Please follow-up with Sharanya Templin to recheck B12 levels 6-8 weeks after starting oral medications   pantoprazole (PROTONIX) 40 MG tablet TAKE 1 TABLET(40 MG) BY MOUTH TWICE DAILY BEFORE A MEAL   Zinc 50 MG CAPS Take 1 capsule by mouth daily.   [DISCONTINUED] nirmatrelvir & ritonavir (PAXLOVID, 300/100,) 20 x 150 MG & 10 x 100MG  TBPK Take as directed   No facility-administered medications prior to visit.    Review of Systems  All other systems reviewed and are negative.  Objective:     BP 138/81   Pulse (!) 55   SpO2 99%  BP Readings from Last 3 Encounters:  11/18/23 138/81  11/03/22 (!) 151/74  10/27/22 129/62   Wt Readings from Last 3 Encounters:  10/27/22 168 lb (76.2 kg)  09/24/22 170 lb 1.3 oz (77.1 kg)  08/01/22 167 lb (75.8 kg)   SpO2 Readings from Last 3 Encounters:  11/18/23 99%  11/03/22 98%  10/27/22 99%   Physical Exam Constitutional:      Appearance: Normal appearance.  HENT:     Head: Normocephalic and atraumatic.  Cardiovascular:     Rate and Rhythm: Normal rate and regular rhythm.     Pulses: Normal pulses.     Heart sounds: Normal heart sounds.  Pulmonary:     Effort: Pulmonary effort is normal.     Breath sounds: Normal breath sounds.  Musculoskeletal:        General: Normal range of motion.     Cervical back: Normal range of motion and neck supple.  Skin:    General: Skin is warm.     Findings: Lesion present. No rash.     Comments: Seborrheic keratosis on the back and right forearm Pedunculated skin tag between both thighs about the size of a quarter.   Neurological:     Mental Status: She is alert.     Last CBC Lab Results  Component Value Date   WBC 11.8 (H) 11/18/2023   HGB 12.8 11/18/2023   HCT 38.7 11/18/2023   MCV 94 11/18/2023   MCH 31.2 11/18/2023   RDW 12.7  11/18/2023   PLT 240 11/18/2023   Last metabolic panel Lab Results  Component Value Date   GLUCOSE 81 11/18/2023   NA 143 11/18/2023   K 3.8 11/18/2023   CL 104 11/18/2023   CO2 23 11/18/2023   BUN 20 11/18/2023   CREATININE 0.84 11/18/2023   EGFR 77 11/18/2023   CALCIUM 9.6 11/18/2023   PROT 6.4 11/18/2023   ALBUMIN 4.1 11/18/2023  LABGLOB 2.3 11/18/2023   AGRATIO 1.7 02/03/2018   BILITOT 0.7 11/18/2023   ALKPHOS 151 (H) 11/18/2023   AST 17 11/18/2023   ALT 29 11/18/2023   Last lipids Lab Results  Component Value Date   CHOL 178 11/18/2023   HDL 47 11/18/2023   LDLCALC 106 (H) 11/18/2023   TRIG 143 11/18/2023   CHOLHDL 3.8 11/18/2023   Last hemoglobin A1c Lab Results  Component Value Date   HGBA1C 6.3 (H) 11/18/2023   Last thyroid functions Lab Results  Component Value Date   TSH 5.000 (H) 11/18/2023   Last vitamin D Lab Results  Component Value Date   VD25OH 34.6 11/18/2023   Assessment & Plan:    Routine Health Maintenance and Physical Exam  Immunization History  Administered Date(s) Administered   Fluad Quad(high Dose 65+) 09/24/2022   Hepatitis B, ADULT 01/03/2013, 03/10/2013   Hepatitis B, PED/ADOLESCENT 01/03/2013, 03/10/2013   Influenza Inj Mdck Quad Pf 07/16/2017   Influenza Split 06/07/2013   Influenza, Seasonal, Injecte, Preservative Fre 10/20/2023   Influenza,inj,Quad PF,6+ Mos 06/09/2016, 06/17/2018, 07/18/2019   Influenza,trivalent, recombinat, inj, PF 07/27/2012, 06/23/2014, 06/12/2015   Influenza-Unspecified 06/23/2015   PPD Test 11/21/2015, 01/26/2018   Tdap 01/03/2013   Zoster Recombinant(Shingrix) 06/17/2018, 08/18/2018    Health Maintenance  Topic Date Due   Medicare Annual Wellness (AWV)  Never done   Pneumonia Vaccine 39+ Years old (1 of 1 - PCV) Never done   DTaP/Tdap/Td (2 - Td or Tdap) 01/04/2023   MAMMOGRAM  03/08/2025   Colonoscopy  10/14/2032   INFLUENZA VACCINE  Completed   DEXA SCAN  Completed   Hepatitis C  Screening  Completed   Zoster Vaccines- Shingrix  Completed   HPV VACCINES  Aged Out   COVID-19 Vaccine  Discontinued   .Marland KitchenDiagnoses and all orders for this visit:  Routine physical examination -     CBC -     CMP14+EGFR -     Lipid panel -     TSH -     Vitamin D (25 hydroxy) -     B12 and Folate Panel -     Hemoglobin A1c  Vitamin D deficiency -     Vitamin D (25 hydroxy)  Vitamin B 12 deficiency -     B12 and Folate Panel  Prediabetes -     Hemoglobin A1c  Mixed hyperlipidemia -     Lipid panel    Discussed health benefits of physical activity, and encouraged her to engage in regular exercise appropriate for her age and condition.  Health Maintenance:  Tdap, pneumococcal vaccines due today - patient denies this today  Colonoscopy UTD Mammogram UTD  2. Bone Health a. Discussed 150 minutes of exercise a week.  b. Encouraged vitamin D 1000 units and Calcium 1300mg  or 4 servings of dairy a day.   3. BP elevated today   a. Encouraged patient to get a BP cuff to monitor at home 4. Seborrhaic Keratosis   a. Discussed with patient that her skin lesions are non-cancerous   b. Hand out given 5. Cherry angiomas  a. Discussed with the patient that these are also benign lesions b. Hand out given 6. Skin tag - pedunculated   a. Patient will come back to have this removed  b. Discussed taking off of work this day     [facility], [address]

## 2023-11-18 NOTE — Addendum Note (Signed)
 Addended by: Monica Becton on: 11/18/2023 09:07 AM   Modules accepted: Orders

## 2023-11-19 LAB — LIPID PANEL
Chol/HDL Ratio: 3.8 {ratio} (ref 0.0–4.4)
Cholesterol, Total: 178 mg/dL (ref 100–199)
HDL: 47 mg/dL (ref >39)
LDL Chol Calc (NIH): 106 mg/dL — ABNORMAL HIGH (ref 0–99)
Triglycerides: 143 mg/dL (ref 0–149)
VLDL Cholesterol Cal: 25 mg/dL (ref 5–40)

## 2023-11-19 LAB — CMP14+EGFR
ALT: 29 [IU]/L (ref 0–32)
AST: 17 [IU]/L (ref 0–40)
Albumin: 4.1 g/dL (ref 3.9–4.9)
Alkaline Phosphatase: 151 [IU]/L — ABNORMAL HIGH (ref 44–121)
BUN/Creatinine Ratio: 24 (ref 12–28)
BUN: 20 mg/dL (ref 8–27)
Bilirubin Total: 0.7 mg/dL (ref 0.0–1.2)
CO2: 23 mmol/L (ref 20–29)
Calcium: 9.6 mg/dL (ref 8.7–10.3)
Chloride: 104 mmol/L (ref 96–106)
Creatinine, Ser: 0.84 mg/dL (ref 0.57–1.00)
Globulin, Total: 2.3 g/dL (ref 1.5–4.5)
Glucose: 81 mg/dL (ref 70–99)
Potassium: 3.8 mmol/L (ref 3.5–5.2)
Sodium: 143 mmol/L (ref 134–144)
Total Protein: 6.4 g/dL (ref 6.0–8.5)
eGFR: 77 mL/min/{1.73_m2} (ref >59)

## 2023-11-19 LAB — VITAMIN D 25 HYDROXY (VIT D DEFICIENCY, FRACTURES): Vit D, 25-Hydroxy: 34.6 ng/mL (ref 30.0–100.0)

## 2023-11-19 LAB — TSH: TSH: 5 u[IU]/mL — ABNORMAL HIGH (ref 0.450–4.500)

## 2023-11-19 LAB — CBC
Hematocrit: 38.7 % (ref 34.0–46.6)
Hemoglobin: 12.8 g/dL (ref 11.1–15.9)
MCH: 31.2 pg (ref 26.6–33.0)
MCHC: 33.1 g/dL (ref 31.5–35.7)
MCV: 94 fL (ref 79–97)
Platelets: 240 10*3/uL (ref 150–450)
RBC: 4.1 x10E6/uL (ref 3.77–5.28)
RDW: 12.7 % (ref 11.7–15.4)
WBC: 11.8 10*3/uL — ABNORMAL HIGH (ref 3.4–10.8)

## 2023-11-19 LAB — B12 AND FOLATE PANEL
Folate: 9.6 ng/mL (ref >3.0)
Vitamin B-12: 1094 pg/mL (ref 232–1245)

## 2023-11-19 LAB — HEMOGLOBIN A1C
Est. average glucose Bld gHb Est-mCnc: 134 mg/dL
Hgb A1c MFr Bld: 6.3 % — ABNORMAL HIGH (ref 4.8–5.6)

## 2023-11-19 NOTE — Telephone Encounter (Signed)
 Patient informed by another nurse earlier today.

## 2023-11-20 ENCOUNTER — Encounter: Payer: Self-pay | Admitting: Physician Assistant

## 2023-11-20 NOTE — Progress Notes (Signed)
 Sharina,   B12 looks great!  Vitamin D has improved but I would still increase by 2000 units daily.  A1C has increased but still in pre-diabetes range. Recheck in 6 months.  Continue to work on low sugar and carb diet.   WBC up a little could be fighting off virus.   LDL not to optimal goal. Are you taking lipitor daily? If you are I would consider increasing to 40mg  and I can send in new prescription.   Marland Kitchen.The 10-year ASCVD risk score (Arnett DK, et al., 2019) is: 7.2%   Values used to calculate the score:     Age: 68 years     Sex: Female     Is Non-Hispanic African American: No     Diabetic: No     Tobacco smoker: No     Systolic Blood Pressure: 138 mmHg     Is BP treated: No     HDL Cholesterol: 47 mg/dL     Total Cholesterol: 178 mg/dL

## 2023-11-24 ENCOUNTER — Telehealth: Payer: Self-pay

## 2023-11-24 MED ORDER — ATORVASTATIN CALCIUM 40 MG PO TABS: 40.0000 mg | ORAL_TABLET | Freq: Every day | ORAL | 3 refills | Status: DC

## 2023-11-24 NOTE — Telephone Encounter (Signed)
 Copied from CRM [id number]. Topic: Clinical - Request for Lab/Test Order >> Nov 23, 2023  5:21 PM Alessandra Bevels wrote: Reason for CRM: Patient is calling back regarding her lab results. Patient is in agreement to increase the LIPITOR to 40MG . Pt would like to know the directions for this. And patient reporting that she was on an antibiotic when came in for lab work.

## 2023-12-22 ENCOUNTER — Encounter: Payer: Self-pay | Admitting: Sports Medicine

## 2023-12-22 ENCOUNTER — Ambulatory Visit (INDEPENDENT_AMBULATORY_CARE_PROVIDER_SITE_OTHER): Payer: Medicare (Managed Care) | Admitting: Sports Medicine

## 2023-12-22 ENCOUNTER — Ambulatory Visit: Payer: Medicare (Managed Care)

## 2023-12-22 DIAGNOSIS — M4307 Spondylolysis, lumbosacral region: Secondary | ICD-10-CM

## 2023-12-22 DIAGNOSIS — D1809 Hemangioma of other sites: Secondary | ICD-10-CM | POA: Diagnosis not present

## 2023-12-22 DIAGNOSIS — M1612 Unilateral primary osteoarthritis, left hip: Secondary | ICD-10-CM

## 2023-12-22 MED ORDER — PREGABALIN 25 MG PO CAPS: ORAL_CAPSULE | ORAL | 3 refills | Status: DC

## 2023-12-22 NOTE — Progress Notes (Signed)
    Procedures performed today:    None.  Independent interpretation of notes and tests performed by another provider:   None.  Brief History, Exam, Impression, and Recommendations:    Lumbosacral spondylolysis Chronic axial low back pain, known lumbar DDD, facet arthropathy. Increasing pain midline low back. On exam she does have some tenderness at the right and left sacroiliac joints. We will start conservatively, baseline x-rays, formal PT, Lyrica, she has multiple other allergies and intolerances. Tylenol not effective. Return to see me in 6 weeks, MR for interventional planning if not better, I do suspect we will be trying the SI joint first. We did go over the evolutionary anthropology of lumbar disc disease, she understands that it is somewhat of a trial and error approach to find the pain generator.  Primary osteoarthritis of left hip Left hip x-rays did show some acetabular spurring consistent with degenerative change, this was not evident on MRI. Groin pain worsened with internal and external rotation is only minimal, so we will put this on the back burner for now.  Hemangioma of bone Left-sided, she has worked with orthopedic oncology, no specific intervention needed, return as needed for this.    ____________________________________________ Ihor Austin. Benjamin Stain, M.D., ABFM., CAQSM., AME. Primary Care and Sports Medicine [place]  Adjunct Professor of Family Medicine  [employer]  Restaurant manager, fast food

## 2023-12-22 NOTE — Assessment & Plan Note (Signed)
 Chronic axial low back pain, known lumbar DDD, facet arthropathy. Increasing pain midline low back. On exam she does have some tenderness at the right and left sacroiliac joints. We will start conservatively, baseline x-rays, formal PT, Lyrica, she has multiple other allergies and intolerances. Tylenol not effective. Return to see me in 6 weeks, MR for interventional planning if not better, I do suspect we will be trying the SI joint first. We did go over the evolutionary anthropology of lumbar disc disease, she understands that it is somewhat of a trial and error approach to find the pain generator.

## 2023-12-22 NOTE — Assessment & Plan Note (Signed)
 Left hip x-rays did show some acetabular spurring consistent with degenerative change, this was not evident on MRI. Groin pain worsened with internal and external rotation is only minimal, so we will put this on the back burner for now.

## 2023-12-22 NOTE — Assessment & Plan Note (Signed)
 Left-sided, she has worked with orthopedic oncology, no specific intervention needed, return as needed for this.

## 2024-01-02 DIAGNOSIS — S0993XA Unspecified injury of face, initial encounter: Secondary | ICD-10-CM | POA: Diagnosis not present

## 2024-01-02 DIAGNOSIS — W1781XA Fall down embankment (hill), initial encounter: Secondary | ICD-10-CM | POA: Diagnosis not present

## 2024-01-02 DIAGNOSIS — J0110 Acute frontal sinusitis, unspecified: Secondary | ICD-10-CM | POA: Diagnosis not present

## 2024-01-03 DIAGNOSIS — W19XXXA Unspecified fall, initial encounter: Secondary | ICD-10-CM | POA: Diagnosis not present

## 2024-01-03 DIAGNOSIS — J0110 Acute frontal sinusitis, unspecified: Secondary | ICD-10-CM | POA: Diagnosis not present

## 2024-01-04 ENCOUNTER — Ambulatory Visit: Payer: Self-pay

## 2024-01-04 NOTE — Telephone Encounter (Signed)
 Patient returning call from office clinical staff. Read message of first available appt with Dr. Elva Hamburger is 01/12/24 9:45am, patient states this date and time will work for her, she is aware this Clinical research associate is not able to schedule appointments for Dr. Sandy Crumb and will need to follow up with office in the morning when opening. Patient inquiring if xray showed any issue with her sacrum. Read radiology report and advised to follow up with provider. Patient has not further questions at this time.  Per [facility] this writer is unable to schedule with Dr. Sandy Crumb. Please follow up with Raeanne Bull.  Reason for Disposition . Requesting regular office appointment  Protocols used: Information Only Call - No Triage-A-AH

## 2024-01-04 NOTE — Telephone Encounter (Signed)
 01/04/24-Left message on patients voicemail to contact the office to schedule an appointment with Dr. Sandy Crumb. First available appointment 01/12/24 at 9:45am with Dr. Elva Hamburger.

## 2024-01-04 NOTE — Telephone Encounter (Signed)
  Chief Complaint: back pain Symptoms: back pain Frequency: since Thursday, worsening Pertinent Negatives: Patient denies weakness, numbness, abdominal pain Disposition: [] ED /[] Urgent Care (no appt availability in office) / [x] Appointment(In office/virtual)/ []  [place] [facility]/ [] Home Care/ [x] Refused Recommended Disposition /[]  Mobile Bus/ []  Follow-up with PCP Additional Notes: Patient reports she had a fall on Thursday while on vacation. She states she had imaging done at the time that came back "fine" but patient reports she has had increasing pain in her lower back, she is currently rating it severe. Patient denies numbness, tingling, and abdominal pain. Per protocol, attempted to schedule in office appt. No availability that works with patients schedule (can only be seen early mornings or early evenings, per patient). Patient reports she will "just wait and see if symptoms improve". Advised patient would send a message to PCP regarding pain levels and to call back with worsening symptoms/if she'd like to be scheduled in one of the available time slots we discussed. Patient verbalized understanding.    Copied from CRM [id number]. Topic: Clinical - Red Word Triage >> Jan 04, 2024  2:10 PM Suzette B wrote: [facility] that prompted transfer to Nurse Triage: Patient stated she Thursday she had some imaging done but today the pain is much more severe that it was when she fell Reason for Disposition  [1] SEVERE pain (e.g., excruciating) AND [2] not improved 2 hours after pain medicine/ice packs  Answer Assessment - Initial Assessment Questions 1. MECHANISM: "How did the injury happen?" (Consider the possibility of domestic violence or elder abuse) Lost balance, fell on back, did have imaging at the time 2. ONSET: "When did the injury happen?" (Minutes or hours ago)     thursday 3. LOCATION: "What part of the back is injured?"     Lower back 4. SEVERITY: "Can you move the back  normally?"     yes 5. PAIN: "Is there any pain?" If Yes, ask: "How bad is the pain?"   (Scale 1-10; or mild, moderate, severe)     severe 6. CORD SYMPTOMS: Any weakness or numbness of the arms or legs?"     none 7. SIZE: For cuts, bruises, or swelling, ask: "How large is it?" (e.g., inches or centimeters)     none 8. TETANUS: For any breaks in the skin, ask: "When was the last tetanus booster?"     none 9. OTHER SYMPTOMS: "Do you have any other symptoms?" (e.g., abdomen pain, blood in urine)     none  Protocols used: Back Injury-A-AH

## 2024-01-05 NOTE — Telephone Encounter (Signed)
 01/05/24-Appointment scheduled 01/14/24 at 10:45 am with Dr. Sandy Crumb. Left message on patients voicemail to confirm appointment.

## 2024-01-07 ENCOUNTER — Other Ambulatory Visit (HOSPITAL_COMMUNITY): Payer: Self-pay

## 2024-01-07 ENCOUNTER — Ambulatory Visit: Payer: Medicare (Managed Care)

## 2024-01-07 ENCOUNTER — Telehealth: Payer: Self-pay

## 2024-01-07 ENCOUNTER — Ambulatory Visit (INDEPENDENT_AMBULATORY_CARE_PROVIDER_SITE_OTHER): Payer: Medicare (Managed Care) | Admitting: Sports Medicine

## 2024-01-07 ENCOUNTER — Encounter: Payer: Self-pay | Admitting: Sports Medicine

## 2024-01-07 DIAGNOSIS — W19XXXA Unspecified fall, initial encounter: Secondary | ICD-10-CM

## 2024-01-07 DIAGNOSIS — M4307 Spondylolysis, lumbosacral region: Secondary | ICD-10-CM | POA: Diagnosis not present

## 2024-01-07 DIAGNOSIS — M533 Sacrococcygeal disorders, not elsewhere classified: Secondary | ICD-10-CM

## 2024-01-07 MED ORDER — DICLOFENAC POTASSIUM 25 MG PO TABS
1.0000 | ORAL_TABLET | Freq: Two times a day (BID) | ORAL | 1 refills | Status: DC
Start: 2024-01-07 — End: 2024-01-11

## 2024-01-07 NOTE — Assessment & Plan Note (Signed)
 Chronic axial low back pain with known lumbar DDD and facet arthropathy, she did have some pain right and left sacroiliac joints, we started conservatively with PT, Lyrica. Lyrica is not tolerable. Unfortunately couple weeks ago she fell hitting her face and wrenching her back and neck. She has severe pain along the midline of her lower lumbar spine as well as persistent pain along the SI joints. She did try some of her friend's diclofenac which seemed to work some happy to call her in some of her own, she is going to be cautious with the dosing with her history of bleeding diverticulitis. We will also double her pantoprazole. Will do this for about 4 weeks and if still having discomfort and if no fractures on x-rays we will proceed with SI joint injections.

## 2024-01-07 NOTE — Progress Notes (Signed)
    Procedures performed today:    None.  Independent interpretation of notes and tests performed by another provider:   None.  Brief History, Exam, Impression, and Recommendations:    Lumbosacral spondylolysis Chronic axial low back pain with known lumbar DDD and facet arthropathy, she did have some pain right and left sacroiliac joints, we started conservatively with PT, Lyrica. Lyrica is not tolerable. Unfortunately couple weeks ago she fell hitting her face and wrenching her back and neck. She has severe pain along the midline of her lower lumbar spine as well as persistent pain along the SI joints. She did try some of her friend's diclofenac which seemed to work some happy to call her in some of her own, she is going to be cautious with the dosing with her history of bleeding diverticulitis. We will also double her pantoprazole. Will do this for about 4 weeks and if still having discomfort and if no fractures on x-rays we will proceed with SI joint injections.    ____________________________________________ Joselyn Nicely. Sandy Crumb, M.D., ABFM., CAQSM., AME. Primary Care and Sports Medicine [facility]  Adjunct Professor of [facility] Medicine  [employer]  Restaurant manager, fast food

## 2024-01-07 NOTE — Telephone Encounter (Signed)
 Pharmacy Patient Advocate Encounter   Received notification from CoverMyMeds that prior authorization for Diclofenac Potassium is required/requested.   Insurance verification completed.   The patient is insured through [employer] .   Per test claim: PA required; PA submitted to above mentioned insurance via CoverMyMeds Key/confirmation #/EOC [id number] Status is pending

## 2024-01-08 ENCOUNTER — Other Ambulatory Visit (HOSPITAL_COMMUNITY): Payer: Self-pay

## 2024-01-08 NOTE — Telephone Encounter (Signed)
 Pharmacy Patient Advocate Encounter  Received notification from [employer] that Prior Authorization for Diclofenac   Potassium has been DENIED.  Full denial letter will be uploaded to the media tab. See denial reason below.   PA #/Case ID/Reference #: [id number]

## 2024-01-11 MED ORDER — DICLOFENAC SODIUM 50 MG PO TBEC: DELAYED_RELEASE_TABLET | ORAL | 11 refills | Status: DC

## 2024-01-11 NOTE — Addendum Note (Signed)
 Addended by: Gean Keels on: 01/11/2024 05:13 PM   Modules accepted: Orders

## 2024-01-11 NOTE — Telephone Encounter (Signed)
 We can have her do the 50s and break them in half.

## 2024-01-12 NOTE — Telephone Encounter (Signed)
 Called and left a detailed voice mail message on patient home # ( allowed on DPR )

## 2024-01-13 DIAGNOSIS — Z1211 Encounter for screening for malignant neoplasm of colon: Secondary | ICD-10-CM | POA: Diagnosis not present

## 2024-01-13 DIAGNOSIS — R1084 Generalized abdominal pain: Secondary | ICD-10-CM | POA: Diagnosis not present

## 2024-01-13 DIAGNOSIS — K219 Gastro-esophageal reflux disease without esophagitis: Secondary | ICD-10-CM | POA: Diagnosis not present

## 2024-01-13 DIAGNOSIS — K760 Fatty (change of) liver, not elsewhere classified: Secondary | ICD-10-CM | POA: Diagnosis not present

## 2024-01-13 DIAGNOSIS — K5900 Constipation, unspecified: Secondary | ICD-10-CM | POA: Diagnosis not present

## 2024-01-14 ENCOUNTER — Other Ambulatory Visit: Payer: Self-pay | Admitting: Sports Medicine

## 2024-01-14 ENCOUNTER — Ambulatory Visit: Payer: Medicare (Managed Care) | Admitting: Sports Medicine

## 2024-01-14 DIAGNOSIS — M4307 Spondylolysis, lumbosacral region: Secondary | ICD-10-CM

## 2024-01-14 MED ORDER — DICLOFENAC SODIUM 50 MG PO TBEC: 50.0000 mg | DELAYED_RELEASE_TABLET | Freq: Two times a day (BID) | ORAL | 11 refills | Status: DC

## 2024-01-20 ENCOUNTER — Telehealth: Payer: Self-pay

## 2024-01-20 NOTE — Telephone Encounter (Signed)
 Copied from CRM [id number]. Topic: Clinical - Prescription Issue >> Jan 20, 2024 11:08 AM Kevelyn M wrote: Patient calling in because pharmacy stated that the new prescription for Dr. Elva Hamburger has on the prescription to cut it in half and the pharmacy will not fill it. This needs to be fixed.

## 2024-01-27 NOTE — Telephone Encounter (Signed)
 Showing in chart as having been changed to one full tablet.

## 2024-02-02 ENCOUNTER — Ambulatory Visit (INDEPENDENT_AMBULATORY_CARE_PROVIDER_SITE_OTHER): Payer: Medicare (Managed Care) | Admitting: Sports Medicine

## 2024-02-02 DIAGNOSIS — M4307 Spondylolysis, lumbosacral region: Secondary | ICD-10-CM

## 2024-02-02 MED ORDER — ACETAMINOPHEN 500 MG PO TABS: 1000.0000 mg | ORAL_TABLET | Freq: Three times a day (TID) | ORAL | Status: AC | PRN

## 2024-02-02 NOTE — Assessment & Plan Note (Signed)
 This is a very pleasant 68 year old female, chronic axial low back pain, pain is localized currently midline with radiation to the left side. She did try some Lyrica  in the past and felt as though it was intolerable. She then had a fall. Unfortunately she has not improved, continues to have axial discogenic low back pain worse with flexion, Valsalva. She borrowed some of her friends diclofenac , unfortunately she has developed some dark tarry stool so I have advised her to discontinue this. Tylenol  to 1000 mg does help her symptoms, I have advised her to do 1000 mg 3 times daily of acetaminophen . We will add MRIs of her lumbar spine and sacrum. This will be both for epidural planning but also looking for sacral insufficiency fracture. For insurance coverage purposes she has failed greater than 6 weeks of physical therapy, medications, and x-rays were unrevealing. I do suspect we will need to follow-up after the MRI to plan either epidurals, facet injections or SI joint injections though I think an epidural is going to give her the best relief.

## 2024-02-02 NOTE — Progress Notes (Signed)
    Procedures performed today:    None.  Independent interpretation of notes and tests performed by another provider:   None.  Brief History, Exam, Impression, and Recommendations:    Lumbosacral spondylolysis This is a very pleasant 68 year old female, chronic axial low back pain, pain is localized currently midline with radiation to the left side. She did try some Lyrica  in the past and felt as though it was intolerable. She then had a fall. Unfortunately she has not improved, continues to have axial discogenic low back pain worse with flexion, Valsalva. She borrowed some of her friends diclofenac , unfortunately she has developed some dark tarry stool so I have advised her to discontinue this. Tylenol  to 1000 mg does help her symptoms, I have advised her to do 1000 mg 3 times daily of acetaminophen . We will add MRIs of her lumbar spine and sacrum. This will be both for epidural planning but also looking for sacral insufficiency fracture. For insurance coverage purposes she has failed greater than 6 weeks of physical therapy, medications, and x-rays were unrevealing. I do suspect we will need to follow-up after the MRI to plan either epidurals, facet injections or SI joint injections though I think an epidural is going to give her the best relief.    ____________________________________________ Joselyn Nicely. Sandy Crumb, M.D., ABFM., CAQSM., AME. Primary Care and Sports Medicine  [facility]  Adjunct Professor of [facility] Medicine  [employer]  Restaurant manager, fast food

## 2024-02-07 ENCOUNTER — Ambulatory Visit: Payer: Medicare (Managed Care)

## 2024-02-07 DIAGNOSIS — M4307 Spondylolysis, lumbosacral region: Secondary | ICD-10-CM

## 2024-02-07 DIAGNOSIS — M545 Low back pain, unspecified: Secondary | ICD-10-CM

## 2024-02-19 ENCOUNTER — Ambulatory Visit (INDEPENDENT_AMBULATORY_CARE_PROVIDER_SITE_OTHER): Payer: Medicare (Managed Care) | Admitting: Medical-Surgical

## 2024-02-19 ENCOUNTER — Encounter: Payer: Self-pay | Admitting: Medical-Surgical

## 2024-02-19 VITALS — BP 116/64 | HR 71 | Resp 20 | Ht 64.0 in | Wt 171.0 lb

## 2024-02-19 DIAGNOSIS — N6009 Solitary cyst of unspecified breast: Secondary | ICD-10-CM | POA: Diagnosis not present

## 2024-02-19 DIAGNOSIS — R21 Rash and other nonspecific skin eruption: Secondary | ICD-10-CM

## 2024-02-19 DIAGNOSIS — Z1231 Encounter for screening mammogram for malignant neoplasm of breast: Secondary | ICD-10-CM

## 2024-02-19 MED ORDER — CLOTRIMAZOLE-BETAMETHASONE 1-0.05 % EX CREA: 1.0000 | TOPICAL_CREAM | Freq: Two times a day (BID) | CUTANEOUS | 1 refills | Status: DC

## 2024-02-19 NOTE — Addendum Note (Signed)
 Addended by: Rogelia Clarks L on: 02/19/2024 05:00 PM   Modules accepted: Orders

## 2024-02-19 NOTE — Progress Notes (Signed)
        Established patient visit  History, exam, impression, and plan:  1. Rash of foot (Primary) Very pleasant 68 year old female presenting today with reports of peeling, cracked skin to the balls of her feet and the bottoms of her toes bilaterally.  Notes that she was at a friend's house and her feet were itchy.  There was a loofah bath mat and she used it to scratch the bottom of her feet.  A couple of days later, she noted that the skin on the bottom of the balls of her feet was dry, cracking, and peeling.  She called her friend and found out that her friend's granddaughter stays over frequently and has a nasty foot rash bilaterally.  Is afraid that she may have caught something from the loofah but that while she was there.  On evaluation, she does have peeling of the top layers of skin on the balls of her feet and in small patches along the bottom of her toes.  The skin underneath is healthy, pink, and intact.  Unclear etiology.  Start Lotrisone twice daily for up to 14 days to the affected areas to cover for any fungal etiology.  Recommend washing in the shoes or footwear that has been worn without wearing socks.  If no benefit from Lotrisone, return for further evaluation. - clotrimazole-betamethasone  (LOTRISONE) cream; Apply 1 Application topically 2 (two) times daily. Use up to 14 days.  Dispense: 60 g; Refill: 1  Procedures performed this visit: None.  Return if symptoms worsen or fail to improve.  __________________________________ Maryl Snook, DNP, APRN, FNP-BC Primary Care and Sports Medicine [facility] [place]

## 2024-02-25 ENCOUNTER — Ambulatory Visit: Payer: Self-pay | Admitting: Sports Medicine

## 2024-02-25 ENCOUNTER — Ambulatory Visit (INDEPENDENT_AMBULATORY_CARE_PROVIDER_SITE_OTHER): Payer: Medicare (Managed Care) | Admitting: Sports Medicine

## 2024-02-25 DIAGNOSIS — D1809 Hemangioma of other sites: Secondary | ICD-10-CM | POA: Diagnosis not present

## 2024-02-25 DIAGNOSIS — M4307 Spondylolysis, lumbosacral region: Secondary | ICD-10-CM

## 2024-02-25 NOTE — Progress Notes (Signed)
    Procedures performed today:    None.  Independent interpretation of notes and tests performed by another provider:   I did personally review the lumbar spine and sacroiliac joint x-rays and MRI, there is disc disease L5-S1 as well as bilateral L4-L5 facet arthritis.  Brief History, Exam, Impression, and Recommendations:    Lumbosacral spondylolysis Amanda Novak returns, she is a very pleasant 68 year old female, chronic axial low back pain mostly midline with radiation to the left side near the SI joints, she also has a history of a large left-sided femoral hemangioma currently followed by [facility] orthopedic oncology. She has never really been consistent with her Lyrica . Pain is predominantly discogenic, worse with flexion and Valsalva. She had tried some of her friends diclofenac , unfortunately developed dark and tarry stools so she was advised to stop, she is also on Tylenol  1000 mg 3 times daily. MRIs of the lumbar spine and sacrum were obtained, we were able to get the results today, she does have multilevel lumbar disc disease worst at L5-S1 predominantly right-sided with bilateral L4-L5 severe facet arthritis. The SI joints did show some arthritis on the MRI but no insufficiency fractures. She has had over 6 weeks of physical therapy. For now she will continue with getting more consistent with Lyrica , she tells me she is only taken the pills a couple of times so she really has not given them the due diligence, if she is consistent with Lyrica  up titration and this fails we can consider a first an epidural followed by facet injections if that does not help.  Hemangioma of left femur Currently working with orthopedic oncology with [facility], weightbearing as tolerated is recommended, no surgical procedures, and simple monitoring.    ____________________________________________ Amanda Novak. Amanda Novak, M.D., ABFM., CAQSM., AME. Primary Care and Sports Medicine [place]  [facility]  Adjunct Professor of [facility] Medicine  [employer]  Restaurant manager, fast food

## 2024-02-25 NOTE — Assessment & Plan Note (Addendum)
 Jolisa returns, she is a very pleasant 68 year old female, chronic axial low back pain mostly midline with radiation to the left side near the SI joints, she also has a history of a large left-sided femoral hemangioma currently followed by [facility] orthopedic oncology. She has never really been consistent with her Lyrica . Pain is predominantly discogenic, worse with flexion and Valsalva. She had tried some of her friends diclofenac , unfortunately developed dark and tarry stools so she was advised to stop, she is also on Tylenol  1000 mg 3 times daily. MRIs of the lumbar spine and sacrum were obtained, we were able to get the results today, she does have multilevel lumbar disc disease worst at L5-S1 predominantly right-sided with bilateral L4-L5 severe facet arthritis. The SI joints did show some arthritis on the MRI but no insufficiency fractures. She has had over 6 weeks of physical therapy. For now she will continue with getting more consistent with Lyrica , she tells me she is only taken the pills a couple of times so she really has not given them the due diligence, if she is consistent with Lyrica  up titration and this fails we can consider a first an epidural followed by facet injections if that does not help.

## 2024-02-25 NOTE — Assessment & Plan Note (Signed)
 Currently working with orthopedic oncology with [facility], weightbearing as tolerated is recommended, no surgical procedures, and simple monitoring.

## 2024-03-18 DIAGNOSIS — H524 Presbyopia: Secondary | ICD-10-CM | POA: Diagnosis not present

## 2024-03-18 DIAGNOSIS — H2513 Age-related nuclear cataract, bilateral: Secondary | ICD-10-CM | POA: Diagnosis not present

## 2024-03-28 ENCOUNTER — Ambulatory Visit: Payer: Medicare (Managed Care) | Admitting: Sports Medicine

## 2024-03-31 ENCOUNTER — Telehealth: Payer: Self-pay

## 2024-03-31 NOTE — Telephone Encounter (Signed)
 Copied from CRM [id number]. Topic: Clinical - Request for Lab/Test Order >> Mar 31, 2024 10:40 AM Mercer PEDLAR wrote: Reason for CRM: Patient called requesting order for diagnostic mammography and bilateral breast US  to be sent to [facility]. She stated that on the paper she has it says that they need a written order so she is not sure if she needs to stop by office to pickup a written order. Please contact patient to let her know when the order is ready.  Patient's callback number: [phone]

## 2024-04-01 NOTE — Telephone Encounter (Signed)
 Reorder under my name so results we come to me. We can fax it but she can come pick up order as well.

## 2024-04-06 DIAGNOSIS — R92333 Mammographic heterogeneous density, bilateral breasts: Secondary | ICD-10-CM | POA: Diagnosis not present

## 2024-04-06 DIAGNOSIS — R928 Other abnormal and inconclusive findings on diagnostic imaging of breast: Secondary | ICD-10-CM | POA: Diagnosis not present

## 2024-04-22 ENCOUNTER — Other Ambulatory Visit: Payer: Self-pay

## 2024-04-22 DIAGNOSIS — Z1231 Encounter for screening mammogram for malignant neoplasm of breast: Secondary | ICD-10-CM

## 2024-04-25 DIAGNOSIS — R197 Diarrhea, unspecified: Secondary | ICD-10-CM | POA: Diagnosis not present

## 2024-04-25 DIAGNOSIS — R1030 Lower abdominal pain, unspecified: Secondary | ICD-10-CM | POA: Diagnosis not present

## 2024-04-25 DIAGNOSIS — K5730 Diverticulosis of large intestine without perforation or abscess without bleeding: Secondary | ICD-10-CM | POA: Diagnosis not present

## 2024-04-25 DIAGNOSIS — K5792 Diverticulitis of intestine, part unspecified, without perforation or abscess without bleeding: Secondary | ICD-10-CM | POA: Diagnosis not present

## 2024-05-24 ENCOUNTER — Encounter: Payer: Self-pay | Admitting: Sports Medicine

## 2024-06-22 ENCOUNTER — Encounter: Payer: Self-pay | Admitting: Physician Assistant

## 2024-06-22 ENCOUNTER — Ambulatory Visit: Payer: Medicare (Managed Care) | Admitting: Physician Assistant

## 2024-06-22 VITALS — BP 157/71 | HR 62 | Temp 98.1°F | Wt 168.0 lb

## 2024-06-22 DIAGNOSIS — B379 Candidiasis, unspecified: Secondary | ICD-10-CM | POA: Diagnosis not present

## 2024-06-22 DIAGNOSIS — J0100 Acute maxillary sinusitis, unspecified: Secondary | ICD-10-CM | POA: Diagnosis not present

## 2024-06-22 DIAGNOSIS — R011 Cardiac murmur, unspecified: Secondary | ICD-10-CM | POA: Diagnosis not present

## 2024-06-22 DIAGNOSIS — T3695XA Adverse effect of unspecified systemic antibiotic, initial encounter: Secondary | ICD-10-CM

## 2024-06-22 LAB — POC SOFIA 2 FLU + SARS ANTIGEN FIA
Influenza A, POC: NEGATIVE
Influenza B, POC: NEGATIVE
SARS Coronavirus 2 Ag: NEGATIVE

## 2024-06-22 LAB — POCT RAPID STREP A (OFFICE): Rapid Strep A Screen: NEGATIVE

## 2024-06-22 MED ORDER — AMOXICILLIN-POT CLAVULANATE 875-125 MG PO TABS: 1.0000 | ORAL_TABLET | Freq: Two times a day (BID) | ORAL | 0 refills | Status: DC

## 2024-06-22 MED ORDER — FLUTICASONE PROPIONATE 50 MCG/ACT NA SUSP: 2.0000 | Freq: Every day | NASAL | 0 refills | Status: DC

## 2024-06-22 MED ORDER — FLUCONAZOLE 150 MG PO TABS: 150.0000 mg | ORAL_TABLET | Freq: Once | ORAL | 0 refills | Status: AC

## 2024-06-22 NOTE — Patient Instructions (Addendum)
 Will order echo of heart to evaluate heart murmur.   Heart Murmur A heart murmur is an extra sound that is caused by turbulent blood flow through the valves of the heart. The murmur can be heard as a hum or whoosh sound when blood flows through the heart. The heart has four areas called chambers. There is a valve for each chamber for the heart. Blood passes through a valve before leaving the chamber. Two of the valves move the blood from the upper chambers of the heart to the lower chambers of the heart (tricuspid valve and mitral valve). The other two valves (aortic valve and pulmonary valve) move the blood to the lungs and the rest of the body. The valves keep blood moving through the heart in the right direction. When the heart valves are not working properly it may cause a murmur. There are two types of heart murmurs: Innocent (benign) murmurs. Most people with this type of heart murmur do not have a heart problem. Many children have innocent heart murmurs. Your health care provider may suggest some basic tests to find out whether your murmur is an innocent murmur. If an innocent heart murmur is found, there is no need for further tests or treatment and no need to restrict activities or stop playing sports. Abnormal murmurs. These types of murmurs can occur in children and adults. Abnormal murmurs may be a sign of a more serious heart condition, such as a heart abnormality present at birth (congenital defect) or heart valve disease. What are the causes? This condition may also be caused by: Pregnancy. Fever. Overactive thyroid  gland. Anemia. Exercise. Rapid growth spurts in children. What are the signs or symptoms? Innocent murmurs do not cause symptoms, and many people with abnormal murmurs may not have symptoms. If symptoms do develop, they may include: Shortness of breath or persistent cough. Blue coloring of the skin, especially on the fingertips. Chest pain. Palpitations, or  feeling a fluttering or skipped heartbeat. Fainting. Getting tired much faster than expected. Swelling in the abdomen, feet, or ankles. How is this diagnosed? This condition may be diagnosed during a routine physical or other exam. If your health care provider hears a murmur with a stethoscope, he or she will listen for: Where the murmur is located in your heart. How loud the murmur is. This may help the health care provider figure out what is causing the murmur. You may be referred to a heart specialist (cardiologist). You may also have other tests, including: Electrocardiogram (ECG or EKG). This test measures the electrical activity of your heart. Echocardiogram. This test uses high frequency sound waves to make pictures of your heart. Chest X-ray. Magnetic resonance imaging (MRI). Cardiac catheterization. This test looks at blood flow through the arteries around the heart. For children and adults who have an abnormal heart murmur and want to stay active, it is important to: Complete testing. Review test results. Receive recommendations from your health care provider. If heart disease is present, it may not be safe to play or be involved in activities that require a lot of effort and energy (are strenuous). How is this treated? Heart murmurs themselves do not need treatment. In some cases, a heart murmur may go away on its own. If an underlying problem or disease is causing the murmur, you may need treatment. If treatment is needed, it will depend on the type and severity of the disease or heart problem causing the murmur. Treatment may include: Medicine. Surgery. Changes to your  lifestyle and diet. Follow these instructions at home: Talk with your health care provider before participating in sports or other activities that are strenuous. Learn as much as possible about your condition and any related diseases. Ask your health care provider if you may be at risk for any medical  emergencies. Talk with your health care provider about what symptoms you should look out for. It is up to you to get your test results. Ask your health care provider, or the department that is doing the test, when your results will be ready. Keep all follow-up visits. This is important. Contact a health care provider if: You are frequently short of breath. You feel more tired than usual. You are having a hard time keeping up with normal activities or fitness routines. You have swelling in your ankles or feet. You notice that your heart often beats irregularly. You develop any new symptoms. Get help right away if: You have chest pain. You are having trouble breathing. You feel light-headed or you faint. Your symptoms suddenly get worse. These symptoms may represent a serious problem that is an emergency. Do not wait to see if the symptoms will go away. Get medical help right away. Call your local emergency services (911 in the [place] Do not drive yourself to the hospital. Summary Heart valves keep blood moving through the heart in the right direction. When the valves are not working properly it may cause a murmur. Innocent murmurs do not cause symptoms, and many people with abnormal murmurs may not have symptoms. You may need treatment if an underlying problem or disease is causing the heart murmur. Treatment may include medicine, surgery, and changes to your lifestyle and diet. Talk with your health care provider before participating in sports or other activities that are strenuous. Get help right away if you have chest pain or trouble breathing. This information is not intended to replace advice given to you by your health care provider. Make sure you discuss any questions you have with your health care provider. Document Revised: 12/17/2020 Document Reviewed: 12/17/2020 Elsevier Patient Education  2024 [employer]  Sinus Infection, Adult A sinus infection, also called sinusitis, is  inflammation of your sinuses. Sinuses are hollow spaces in the bones around your face. Your sinuses are located: Around your eyes. In the middle of your forehead. Behind your nose. In your cheekbones. Mucus normally drains out of your sinuses. When your nasal tissues become inflamed or swollen, mucus can become trapped or blocked. This allows bacteria, viruses, and fungi to grow, which leads to infection. Most infections of the sinuses are caused by a virus. A sinus infection can develop quickly. It can last for up to 4 weeks (acute) or for more than 12 weeks (chronic). A sinus infection often develops after a cold. What are the causes? This condition is caused by anything that creates swelling in the sinuses or stops mucus from draining. This includes: Allergies. Asthma. Infection from bacteria or viruses. Deformities or blockages in your nose or sinuses. Abnormal growths in the nose (nasal polyps). Pollutants, such as chemicals or irritants in the air. Infection from fungi. This is rare. What increases the risk? You are more likely to develop this condition if you: Have a weak body defense system (immune system). Do a lot of swimming or diving. Overuse nasal sprays. Smoke. What are the signs or symptoms? The main symptoms of this condition are pain and a feeling of pressure around the affected sinuses. Other symptoms include: Stuffy nose  or congestion that makes it difficult to breathe through your nose. Thick yellow or greenish drainage from your nose. Tenderness, swelling, and warmth over the affected sinuses. A cough that may get worse at night. Decreased sense of smell and taste. Extra mucus that collects in the throat or the back of the nose (postnasal drip) causing a sore throat or bad breath. Tiredness (fatigue). Fever. How is this diagnosed? This condition is diagnosed based on: Your symptoms. Your medical history. A physical exam. Tests to find out if your condition is  acute or chronic. This may include: Checking your nose for nasal polyps. Viewing your sinuses using a device that has a light (endoscope). Testing for allergies or bacteria. Imaging tests, such as an MRI or CT scan. In rare cases, a bone biopsy may be done to rule out more serious types of fungal sinus disease. How is this treated? Treatment for a sinus infection depends on the cause and whether your condition is chronic or acute. If caused by a virus, your symptoms should go away on their own within 10 days. You may be given medicines to relieve symptoms. They include: Medicines that shrink swollen nasal passages (decongestants). A spray that eases inflammation of the nostrils (topical intranasal corticosteroids). Rinses that help get rid of thick mucus in your nose (nasal saline washes). Medicines that treat allergies (antihistamines). Over-the-counter pain relievers. If caused by bacteria, your health care provider may recommend waiting to see if your symptoms improve. Most bacterial infections will get better without antibiotic medicine. You may be given antibiotics if you have: A severe infection. A weak immune system. If caused by narrow nasal passages or nasal polyps, surgery may be needed. Follow these instructions at home: Medicines Take, use, or apply over-the-counter and prescription medicines only as told by your health care provider. These may include nasal sprays. If you were prescribed an antibiotic medicine, take it as told by your health care provider. Do not stop taking the antibiotic even if you start to feel better. Hydrate and humidify  Drink enough fluid to keep your urine pale yellow. Staying hydrated will help to thin your mucus. Use a cool mist humidifier to keep the humidity level in your home above 50%. Inhale steam for 10-15 minutes, 3-4 times a day, or as told by your health care provider. You can do this in the bathroom while a hot shower is running. Limit  your exposure to cool or dry air. Rest Rest as much as possible. Sleep with your head raised (elevated). Make sure you get enough sleep each night. General instructions  Apply a warm, moist washcloth to your face 3-4 times a day or as told by your health care provider. This will help with discomfort. Use nasal saline washes as often as told by your health care provider. Wash your hands often with soap and water to reduce your exposure to germs. If soap and water are not available, use hand sanitizer. Do not smoke. Avoid being around people who are smoking (secondhand smoke). Keep all follow-up visits. This is important. Contact a health care provider if: You have a fever. Your symptoms get worse. Your symptoms do not improve within 10 days. Get help right away if: You have a severe headache. You have persistent vomiting. You have severe pain or swelling around your face or eyes. You have vision problems. You develop confusion. Your neck is stiff. You have trouble breathing. These symptoms may be an emergency. Get help right away. Call 911. Do  not wait to see if the symptoms will go away. Do not drive yourself to the hospital. Summary A sinus infection is soreness and inflammation of your sinuses. Sinuses are hollow spaces in the bones around your face. This condition is caused by nasal tissues that become inflamed or swollen. The swelling traps or blocks the flow of mucus. This allows bacteria, viruses, and fungi to grow, which leads to infection. If you were prescribed an antibiotic medicine, take it as told by your health care provider. Do not stop taking the antibiotic even if you start to feel better. Keep all follow-up visits. This is important. This information is not intended to replace advice given to you by your health care provider. Make sure you discuss any questions you have with your health care provider. Document Revised: 08/13/2021 Document Reviewed:  08/13/2021 Elsevier Patient Education  2024 [employer].

## 2024-06-22 NOTE — Progress Notes (Unsigned)
 Acute Office Visit  Subjective:     Patient ID: Amanda Novak, female    DOB: 1956/09/10, 68 y.o.   MRN: [id number]   HPI Pt is a 68 yo female who presents to the clinic with 2 weeks of congestion, cough, sinus pressure, headache, ear popping. She was exposed to some really cold vent air at her job.Seemed to worsen of the past 2 days. She is taking OTC dayquil and nyquil. Helps some. Not had her flu or covid vaccine this year. No known fever.   ROS See HPI.     Objective:    BP (!) 157/71 (Cuff Size: Normal)   Pulse 62   Temp 98.1 F (36.7 C) (Oral)   Wt 168 lb (76.2 kg)   SpO2 98%   BMI 28.84 kg/m  BP Readings from Last 3 Encounters:  06/22/24 (!) 157/71  02/19/24 116/64  11/18/23 138/81   Wt Readings from Last 3 Encounters:  06/22/24 168 lb (76.2 kg)  02/19/24 171 lb (77.6 kg)  10/27/22 168 lb (76.2 kg)    .Amanda Novak Results for orders placed or performed in visit on 06/22/24  POCT rapid strep A   Collection Time: 06/22/24  7:44 AM  Result Value Ref Range   Rapid Strep A Screen Negative Negative  POC SOFIA 2 FLU + SARS ANTIGEN FIA   Collection Time: 06/22/24  7:45 AM  Result Value Ref Range   Influenza A, POC Negative Negative   Influenza B, POC Negative Negative   SARS Coronavirus 2 Ag Negative Negative     Physical Exam Constitutional:      Appearance: Normal appearance.  HENT:     Head: Normocephalic.     Right Ear: Tympanic membrane, ear canal and external ear normal. There is no impacted cerumen.     Left Ear: Tympanic membrane, ear canal and external ear normal. There is no impacted cerumen.     Nose: Nose normal.     Mouth/Throat:     Mouth: Mucous membranes are moist.     Pharynx: No oropharyngeal exudate or posterior oropharyngeal erythema.  Cardiovascular:     Rate and Rhythm: Normal rate and regular rhythm.     Pulses: Normal pulses.     Heart sounds: Murmur heard.  Pulmonary:     Effort: Pulmonary effort is normal.     Breath sounds:  Normal breath sounds.  Musculoskeletal:     Cervical back: Normal range of motion and neck supple. No tenderness.  Lymphadenopathy:     Cervical: No cervical adenopathy.  Neurological:     General: No focal deficit present.     Mental Status: She is alert and oriented to person, place, and time.  Psychiatric:        Mood and Affect: Mood normal.          Assessment & Plan:  [name] was seen today for cough.  Diagnoses and all orders for this visit:  Acute non-recurrent maxillary sinusitis -     amoxicillin -clavulanate (AUGMENTIN ) 875-125 MG tablet; Take 1 tablet by mouth 2 (two) times daily. -     fluticasone  (FLONASE ) 50 MCG/ACT nasal spray; Place 2 sprays into both nostrils daily. -     POC SOFIA 2 FLU + SARS ANTIGEN FIA -     POCT rapid strep A  Heart murmur, systolic -     ECHOCARDIOGRAM COMPLETE; Future  Antibiotic-induced yeast infection -     fluconazole (DIFLUCAN) 150 MG tablet; Take 1 tablet (150 mg total)  by mouth once for 1 dose.   Murmur louder today and has not had an echo in years. Will order echo for monitoring.   Flu/covid/strep negative Due to longevity of symptoms and normal lung sounds suspect sinusitis Augmentin  and flonase  to start Continue other symptomatic care Diflucan for yeast after antibiotic Follow up as needed if symptoms persist or worsen  After feeling better encouraged to get flu shot for this year.   [name], PA-C

## 2024-06-23 ENCOUNTER — Telehealth: Payer: Self-pay

## 2024-06-23 DIAGNOSIS — Z1211 Encounter for screening for malignant neoplasm of colon: Secondary | ICD-10-CM | POA: Diagnosis not present

## 2024-06-23 DIAGNOSIS — R1011 Right upper quadrant pain: Secondary | ICD-10-CM | POA: Diagnosis not present

## 2024-06-23 DIAGNOSIS — K5792 Diverticulitis of intestine, part unspecified, without perforation or abscess without bleeding: Secondary | ICD-10-CM | POA: Diagnosis not present

## 2024-06-23 DIAGNOSIS — K219 Gastro-esophageal reflux disease without esophagitis: Secondary | ICD-10-CM | POA: Diagnosis not present

## 2024-06-23 DIAGNOSIS — K760 Fatty (change of) liver, not elsewhere classified: Secondary | ICD-10-CM | POA: Diagnosis not present

## 2024-06-23 DIAGNOSIS — R1084 Generalized abdominal pain: Secondary | ICD-10-CM | POA: Diagnosis not present

## 2024-06-23 DIAGNOSIS — K5900 Constipation, unspecified: Secondary | ICD-10-CM | POA: Diagnosis not present

## 2024-06-23 NOTE — Telephone Encounter (Signed)
 Spoke with patient and she is aware of the directions for the prescription Augmentin .    Copied from CRM #[id number]. Topic: Clinical - Medication Question >> Jun 23, 2024  8:54 AM Tobias CROME wrote: Reason for CRM: Patient requesting clarification on prescription on Augmentin , patient states the directions on the bottle are not clear.   Requesting callback can leave voicemail with directions: [phone]

## 2024-06-28 DIAGNOSIS — K219 Gastro-esophageal reflux disease without esophagitis: Secondary | ICD-10-CM | POA: Diagnosis not present

## 2024-06-28 DIAGNOSIS — K824 Cholesterolosis of gallbladder: Secondary | ICD-10-CM | POA: Diagnosis not present

## 2024-07-07 DIAGNOSIS — R072 Precordial pain: Secondary | ICD-10-CM | POA: Diagnosis not present

## 2024-07-07 DIAGNOSIS — K219 Gastro-esophageal reflux disease without esophagitis: Secondary | ICD-10-CM | POA: Diagnosis not present

## 2024-07-07 DIAGNOSIS — Z79899 Other long term (current) drug therapy: Secondary | ICD-10-CM | POA: Diagnosis not present

## 2024-07-07 DIAGNOSIS — R9431 Abnormal electrocardiogram [ECG] [EKG]: Secondary | ICD-10-CM | POA: Diagnosis not present

## 2024-07-07 DIAGNOSIS — I498 Other specified cardiac arrhythmias: Secondary | ICD-10-CM | POA: Diagnosis not present

## 2024-07-07 DIAGNOSIS — R110 Nausea: Secondary | ICD-10-CM | POA: Diagnosis not present

## 2024-07-07 DIAGNOSIS — M1990 Unspecified osteoarthritis, unspecified site: Secondary | ICD-10-CM | POA: Diagnosis not present

## 2024-07-07 DIAGNOSIS — E785 Hyperlipidemia, unspecified: Secondary | ICD-10-CM | POA: Diagnosis not present

## 2024-07-07 DIAGNOSIS — R079 Chest pain, unspecified: Secondary | ICD-10-CM | POA: Diagnosis not present

## 2024-07-07 DIAGNOSIS — I4891 Unspecified atrial fibrillation: Secondary | ICD-10-CM | POA: Diagnosis not present

## 2024-07-12 ENCOUNTER — Ambulatory Visit (HOSPITAL_BASED_OUTPATIENT_CLINIC_OR_DEPARTMENT_OTHER)
Admission: RE | Admit: 2024-07-12 | Discharge: 2024-07-12 | Disposition: A | Discharge status: Home / Self Care | Payer: Medicare (Managed Care) | Source: Ambulatory Visit | Attending: Physician Assistant | Admitting: Physician Assistant

## 2024-07-12 DIAGNOSIS — R011 Cardiac murmur, unspecified: Secondary | ICD-10-CM | POA: Diagnosis not present

## 2024-07-12 LAB — ECHOCARDIOGRAM COMPLETE
AR max vel: 1.31 cm2
AV Area VTI: 1.46 cm2
AV Area mean vel: 1.4 cm2
AV Mean grad: 7.5 mmHg
AV Peak grad: 15.3 mmHg
Ao pk vel: 1.96 m/s
Area-P 1/2: 3.08 cm2
Calc EF: 62.7 %
S' Lateral: 2.4 cm
Single Plane A2C EF: 69.4 %
Single Plane A4C EF: 57.3 %

## 2024-07-13 ENCOUNTER — Ambulatory Visit: Payer: Self-pay | Admitting: Physician Assistant

## 2024-07-13 DIAGNOSIS — I340 Nonrheumatic mitral (valve) insufficiency: Secondary | ICD-10-CM | POA: Insufficient documentation

## 2024-07-13 DIAGNOSIS — I351 Nonrheumatic aortic (valve) insufficiency: Secondary | ICD-10-CM | POA: Insufficient documentation

## 2024-07-13 DIAGNOSIS — I359 Nonrheumatic aortic valve disorder, unspecified: Secondary | ICD-10-CM | POA: Insufficient documentation

## 2024-07-13 NOTE — Progress Notes (Signed)
 Rollo,   Echo showed aortic valve calcification with some mild regurgitation and mild mitral valve regurgitation. We need to follow this and recheck in one year. Let me know sooner if you have any lower extremity swelling, worsening shortness of breath, trouble laying flat, chest tightness or pain.

## 2024-07-15 ENCOUNTER — Telehealth: Payer: Self-pay

## 2024-07-15 DIAGNOSIS — R0789 Other chest pain: Secondary | ICD-10-CM | POA: Diagnosis not present

## 2024-07-15 DIAGNOSIS — R42 Dizziness and giddiness: Secondary | ICD-10-CM | POA: Diagnosis not present

## 2024-07-15 DIAGNOSIS — R1084 Generalized abdominal pain: Secondary | ICD-10-CM | POA: Diagnosis not present

## 2024-07-15 DIAGNOSIS — K219 Gastro-esophageal reflux disease without esophagitis: Secondary | ICD-10-CM | POA: Diagnosis not present

## 2024-07-15 NOTE — Telephone Encounter (Signed)
 Copied from CRM [id number]. Topic: Clinical - Lab/Test Results >> Jul 15, 2024  3:01 PM Winona R wrote: Pt calling for echo cardiogram results and would like to know what she is to do next. Please follow up

## 2024-07-15 NOTE — Telephone Encounter (Signed)
 Spoke with patient. Gave echo results and message forwarded to Vermell Fruit, PA In separate message.

## 2024-08-01 ENCOUNTER — Telehealth: Payer: Self-pay

## 2024-08-01 NOTE — Telephone Encounter (Signed)
 Copied from CRM #[id number]. Topic: Clinical - Medical Advice >> Aug 01, 2024  9:26 AM Treva T wrote: Reason for CRM: Received call from daughter, Harlene, states she will be coming to appt on Wednesday, 08/03/24, with patient, but would like to have a baseline memory questionaire done at the upcoming visit with mom. Daughter is requesting this not be mentioned at upcoming appointment, but addressed as a routine part of the appointment.   Requesting one of these memory tests done, MOCA, or MMSE due to some memory and cognitive changes she has noticed with mother.   Also reports some famiy members has been recently diagnsed with dementia.  If need further to discuss, can be reached at [phone].   No medical information released as caller was not on DPR, information taken was information provided by caller. Advised mom would need to update DPR with her name listed if needed to discuss medical information regarding patient.

## 2024-08-01 NOTE — Telephone Encounter (Signed)
 Please make note of this. Lets do a MOCA memory screening test.

## 2024-08-03 ENCOUNTER — Ambulatory Visit (INDEPENDENT_AMBULATORY_CARE_PROVIDER_SITE_OTHER): Payer: Medicare (Managed Care) | Admitting: Physician Assistant

## 2024-08-03 ENCOUNTER — Encounter: Payer: Self-pay | Admitting: Physician Assistant

## 2024-08-03 VITALS — BP 113/67 | HR 61 | Ht 64.0 in | Wt 165.0 lb

## 2024-08-03 DIAGNOSIS — I351 Nonrheumatic aortic (valve) insufficiency: Secondary | ICD-10-CM

## 2024-08-03 DIAGNOSIS — R011 Cardiac murmur, unspecified: Secondary | ICD-10-CM | POA: Diagnosis not present

## 2024-08-03 DIAGNOSIS — R21 Rash and other nonspecific skin eruption: Secondary | ICD-10-CM | POA: Diagnosis not present

## 2024-08-03 DIAGNOSIS — B353 Tinea pedis: Secondary | ICD-10-CM

## 2024-08-03 DIAGNOSIS — K219 Gastro-esophageal reflux disease without esophagitis: Secondary | ICD-10-CM | POA: Diagnosis not present

## 2024-08-03 DIAGNOSIS — R413 Other amnesia: Secondary | ICD-10-CM | POA: Diagnosis not present

## 2024-08-03 DIAGNOSIS — I359 Nonrheumatic aortic valve disorder, unspecified: Secondary | ICD-10-CM

## 2024-08-03 DIAGNOSIS — I340 Nonrheumatic mitral (valve) insufficiency: Secondary | ICD-10-CM | POA: Diagnosis not present

## 2024-08-03 DIAGNOSIS — R0789 Other chest pain: Secondary | ICD-10-CM

## 2024-08-03 DIAGNOSIS — Z23 Encounter for immunization: Secondary | ICD-10-CM | POA: Diagnosis not present

## 2024-08-03 MED ORDER — CLOTRIMAZOLE-BETAMETHASONE 1-0.05 % EX CREA: 1.0000 | TOPICAL_CREAM | Freq: Two times a day (BID) | CUTANEOUS | 1 refills | Status: AC

## 2024-08-03 MED ORDER — METHYLPREDNISOLONE ACETATE 40 MG/ML IJ SUSP
40.0000 mg | Freq: Once | INTRAMUSCULAR | Status: AC
  Administered 2024-08-03: 40 mg via INTRAMUSCULAR

## 2024-08-03 NOTE — Progress Notes (Signed)
 Established Patient Office Visit  Subjective   Patient ID: Amanda Novak, female    DOB: [birth year]  Age: 68 y.o. MRN: [id number]  Chief Complaint  Patient presents with   Hospitalization Follow-up    HPI .SABRADiscussed the use of AI scribe software for clinical note transcription with the patient, who gave verbal consent to proceed.  History of Present Illness Amanda Novak is a 68 year old female who presents for follow-up after an ED visit for chest pressure/pain and dizziness.  Chest pressure and dizziness - Experienced central chest pressure radiating to the left side and dizziness during ED visit on July 08, 2024 - Blood pressure at the time was 160/92 mmHg - Troponin levels were normal on repeated tests - White blood count was slightly elevated at 11.1 - Chest X-ray and EKG showed no acute findings - Treated with antacids in the ED - Ongoing mild chest pressure persists - Shortness of breath and coughing occurred during the initial episode but are not present currently - Upcoming cardiology appointment scheduled - Echocardiogram revealed normal ejection fraction (60-70%), trivial mitral and aortic valve regurgitation, and aortic valve calcification  Gastrointestinal symptoms and history - Follow-up with gastroenterologist on July 15, 2024 - Last colonoscopy in January 2024 showed diverticulosis - EGD in August 2018 and liver biopsy in 2015 showed steatosis with minimal fibrosis - Currently taking pantoprazole  40 mg daily - Bowel movements are regular - Diet includes beet greens and beets  Medication management - Methotrexate dose reduced from twice daily to once daily by gastroenterologist - Gabapentin  used as needed, but should be taken daily - Pantoprazole  40 mg daily for gastrointestinal symptoms  Blood pressure monitoring - Occasionally checks blood pressure at home - Preoccupied with husband's and granddaughter's recent hospitalizations, which may  affect frequency of monitoring  Dermatologic symptoms - Peeling on the bottom of feet previously treated with topical antifungal, which resolved temporarily - Continues to wear socks and keeps feet dry  Cognitive concerns - Children have expressed concerns about her memory - Attributes memory issues to stress related to recent family health events    ROS See HPI.    Objective:     BP 113/67   Pulse 61   Ht 5' 4 (1.626 m)   Wt 165 lb (74.8 kg)   SpO2 99%   BMI 28.32 kg/m  BP Readings from Last 3 Encounters:  08/03/24 113/67  06/22/24 (!) 157/71  02/19/24 116/64   Wt Readings from Last 3 Encounters:  08/03/24 165 lb (74.8 kg)  06/22/24 168 lb (76.2 kg)  02/19/24 171 lb (77.6 kg)    ..    08/03/2024    9:00 AM  Montreal Cognitive Assessment   Visuospatial/ Executive (0/5) 5  Naming (0/3) 3  Attention: Read list of digits (0/2) 2  Attention: Read list of letters (0/1) 1  Attention: Serial 7 subtraction starting at 100 (0/3) 3  Language: Repeat phrase (0/2) 2  Language : Fluency (0/1) 1  Abstraction (0/2) 2  Delayed Recall (0/5) 5  Orientation (0/6) 6  Total 30     Physical Exam Constitutional:      Appearance: Normal appearance.  HENT:     Head: Normocephalic.  Cardiovascular:     Rate and Rhythm: Normal rate.     Heart sounds: Murmur heard.  Pulmonary:     Effort: Pulmonary effort is normal.     Breath sounds: Normal breath sounds.     Comments: Tenderness to palpation over sternum and  the worst over xiphoid process.  Musculoskeletal:     Cervical back: Normal range of motion and neck supple. No tenderness.     Right lower leg: No edema.     Left lower leg: No edema.  Lymphadenopathy:     Cervical: No cervical adenopathy.  Skin:    Comments: Bilateral feet peeling with slight erythema.   Neurological:     General: No focal deficit present.     Mental Status: She is alert and oriented to person, place, and time.  Psychiatric:        Mood and  Affect: Mood normal.      The 10-year ASCVD risk score (Arnett DK, et al., 2019) is: 5.4%    Assessment & Plan:  SABRASABRASherena was seen today for hospitalization follow-up.  Diagnoses and all orders for this visit:  Xiphoidalgia -     CT Chest Wo Contrast; Future -     methylPREDNISolone  acetate (DEPO-MEDROL ) injection 40 mg  Tinea pedis of both feet -     clotrimazole -betamethasone  (LOTRISONE ) cream; Apply 1 Application topically 2 (two) times daily. Use up to 14 days.  Memory changes  Gastroesophageal reflux disease, unspecified whether esophagitis present  Calcification of aortic valve  Nonrheumatic mitral valve regurgitation  Nonrheumatic aortic valve insufficiency  Heart murmur, systolic  Rash of foot -     clotrimazole -betamethasone  (LOTRISONE ) cream; Apply 1 Application topically 2 (two) times daily. Use up to 14 days.  Immunization due -     Flu vaccine HIGH DOSE PF(Fluzone Trivalent)   Assessment & Plan Chest pain with sternal tenderness Intermittent chest pressure with sternal tenderness, particularly at the xiphoid process. Normal EKG, chest X-ray, and troponin levels. Echocardiogram shows normal ejection fraction with trivial mitral and aortic valve regurgitation. Differential includes musculoskeletal pain due to sternal inflammation. No acute cardiac findings. - Consider CT scan to rule out other causes of sternal tenderness. - Consider prednisone  injection for anti-inflammatory effect if musculoskeletal pain is suspected. Depo- Medrol  given in office today for pain relief and to help with inflammation.   Gastroesophageal reflux disease (GERD) GERD managed with pantoprazole  40 mg daily. Recent ED visit for chest pain and dizziness, initially suspected to be cardiac but later attributed to GERD. No changes in GERD management by gastroenterologist. - Continue pantoprazole  40 mg daily.  Hypertension Blood pressure elevated at 160/92 during recent ED visit.  Current blood pressure at 2nd recheck looks GREAT.  Stress and lack of regular home monitoring noted. - Encouraged regular home blood pressure monitoring.  Aortic valve calcification with trivial regurgitation and mitral valve trivial regurgitation Echocardiogram shows trivial regurgitation and calcification of the aortic valve, with no significant narrowing. Mitral valve also shows trivial regurgitation. No current symptoms attributed to these findings. - Follow up with cardiology for further evaluation and management.  Memory concerns Reports of memory concerns, possibly stress-related. No formal memory screening conducted recently. - Conducted memory screening test. MOCA was perfect.   Tinea pedis (athlete's foot) Previous episode of tinea pedis resolved with treatment. - Advised on preventive measures including treating shoes with antifungal spray and keeping feet dry. - clotrimazole /betamethazone cream sent to pharmacy today  Flu shot given today in office.   Return in about 8 weeks (around 09/28/2024).    Dasie Chancellor, PA-C

## 2024-08-03 NOTE — Patient Instructions (Addendum)
 Will get CT of chest to look at xiphoid process.  Keep follow up with cardiology.   Use 1 fingertip of topical cream with unscented lotion daily.  Soak feet and use pummel stone after then apply lotion and place in socks.

## 2024-08-09 ENCOUNTER — Ambulatory Visit: Payer: Medicare (Managed Care)

## 2024-08-09 ENCOUNTER — Encounter: Payer: Self-pay | Admitting: Physician Assistant

## 2024-08-09 DIAGNOSIS — R0789 Other chest pain: Secondary | ICD-10-CM | POA: Diagnosis not present

## 2024-08-09 DIAGNOSIS — I700 Atherosclerosis of aorta: Secondary | ICD-10-CM | POA: Diagnosis not present

## 2024-08-11 DIAGNOSIS — I10 Essential (primary) hypertension: Secondary | ICD-10-CM | POA: Diagnosis not present

## 2024-08-11 DIAGNOSIS — I358 Other nonrheumatic aortic valve disorders: Secondary | ICD-10-CM | POA: Diagnosis not present

## 2024-08-15 ENCOUNTER — Ambulatory Visit: Payer: Self-pay | Admitting: Physician Assistant

## 2024-08-15 NOTE — Progress Notes (Signed)
 Amanda Novak,   No structural abnormality seen over xiphoid process and area of tenderness. How are you feeling?

## 2024-08-16 ENCOUNTER — Telehealth: Payer: Self-pay

## 2024-08-16 NOTE — Telephone Encounter (Signed)
 I would keep the appointment for now and that way if her pain starts to improve then absolutely she can cancel it in that way if she still hurting at that point they can come up with a different plan.  So I really would encourage her to keep it but if she does end up canceling please try to cancel at least 24 hours in advance if at all possible.

## 2024-08-16 NOTE — Telephone Encounter (Signed)
 Copied from CRM [id number]. Topic: Clinical - Lab/Test Results >> Aug 15, 2024  3:34 PM Avram MATSU wrote: Reason for CRM: please call patient back regarding her test results, please advise [phone] (M)

## 2024-08-16 NOTE — Telephone Encounter (Signed)
 2 ES tylenol  3 x a day instead of NSAID

## 2024-09-07 ENCOUNTER — Ambulatory Visit: Payer: Medicare (Managed Care) | Admitting: Physician Assistant

## 2024-09-07 ENCOUNTER — Encounter: Payer: Self-pay | Admitting: Physician Assistant

## 2024-09-07 VITALS — BP 140/80 | HR 61 | Ht 64.0 in | Wt 164.0 lb

## 2024-09-07 DIAGNOSIS — M4307 Spondylolysis, lumbosacral region: Secondary | ICD-10-CM | POA: Diagnosis not present

## 2024-09-07 DIAGNOSIS — J0100 Acute maxillary sinusitis, unspecified: Secondary | ICD-10-CM

## 2024-09-07 DIAGNOSIS — K219 Gastro-esophageal reflux disease without esophagitis: Secondary | ICD-10-CM | POA: Diagnosis not present

## 2024-09-07 DIAGNOSIS — R011 Cardiac murmur, unspecified: Secondary | ICD-10-CM | POA: Diagnosis not present

## 2024-09-07 DIAGNOSIS — R0789 Other chest pain: Secondary | ICD-10-CM | POA: Diagnosis not present

## 2024-09-07 DIAGNOSIS — R030 Elevated blood-pressure reading, without diagnosis of hypertension: Secondary | ICD-10-CM

## 2024-09-07 MED ORDER — CYCLOBENZAPRINE HCL 5 MG PO TABS: 5.0000 mg | ORAL_TABLET | Freq: Three times a day (TID) | ORAL | 1 refills | Status: AC | PRN

## 2024-09-07 MED ORDER — FLUTICASONE PROPIONATE 50 MCG/ACT NA SUSP: 2.0000 | Freq: Every day | NASAL | 0 refills | Status: AC

## 2024-09-07 MED ORDER — METHYLPREDNISOLONE SODIUM SUCC 125 MG IJ SOLR
125.0000 mg | Freq: Once | INTRAMUSCULAR | Status: AC
  Administered 2024-09-07: 16:00:00 125 mg via INTRAMUSCULAR

## 2024-09-07 NOTE — Patient Instructions (Addendum)
 DASH diet to work on BP with regular walking.   Flonase  to help with dizziness.   Flexeril  as needed to help with sternum pain. Use biofreeze or voltaren  as well.   DASH Eating Plan DASH stands for Dietary Approaches to Stop Hypertension. The DASH eating plan is a healthy eating plan that has been shown to: Lower high blood pressure (hypertension). Reduce your risk for type 2 diabetes, heart disease, and stroke. Help with weight loss. What are tips for following this plan? Reading food labels Check food labels for the amount of salt (sodium) per serving. Choose foods with less than 5 percent of the Daily Value (DV) of sodium. In general, foods with less than 300 milligrams (mg) of sodium per serving fit into this eating plan. To find whole grains, look for the word whole as the first word in the ingredient list. Shopping Buy products labeled as low-sodium or no salt added. Buy fresh foods. Avoid canned foods and pre-made or frozen meals. Cooking Try not to add salt when you cook. Use salt-free seasonings or herbs instead of table salt or sea salt. Check with your health care provider or pharmacist before using salt substitutes. Do not fry foods. Cook foods in healthy ways, such as baking, boiling, grilling, roasting, or broiling. Cook using oils that are good for your heart. These include olive, canola, avocado, soybean, and sunflower oil. Meal planning  Eat a balanced diet. This should include: 4 or more servings of fruits and 4 or more servings of vegetables each day. Try to fill half of your plate with fruits and vegetables. 6-8 servings of whole grains each day. 6 or less servings of lean meat, poultry, or fish each day. 1 oz is 1 serving. A 3 oz (85 g) serving of meat is about the same size as the palm of your hand. One egg is 1 oz (28 g). 2-3 servings of low-fat dairy each day. One serving is 1 cup (237 mL). 1 serving of nuts, seeds, or beans 5 times each week. 2-3 servings  of heart-healthy fats. Healthy fats called omega-3 fatty acids are found in foods such as walnuts, flaxseeds, fortified milks, and eggs. These fats are also found in cold-water fish, such as sardines, salmon, and mackerel. Limit how much you eat of: Canned or prepackaged foods. Food that is high in trans fat, such as fried foods. Food that is high in saturated fat, such as fatty meat. Desserts and other sweets, sugary drinks, and other foods with added sugar. Full-fat dairy products. Do not salt foods before eating. Do not eat more than 4 egg yolks a week. Try to eat at least 2 vegetarian meals a week. Eat more home-cooked food and less restaurant, buffet, and fast food. Lifestyle When eating at a restaurant, ask if your food can be made with less salt or no salt. If you drink alcohol: Limit how much you have to: 0-1 drink a day if you are female. 0-2 drinks a day if you are female. Know how much alcohol is in your drink. In the [place] one drink is one 12 oz bottle of beer (355 mL), one 5 oz glass of wine (148 mL), or one 1 oz glass of hard liquor (44 mL). General information Avoid eating more than 2,300 mg of salt a day. If you have hypertension, you may need to reduce your sodium intake to 1,500 mg a day. Work with your provider to stay at a healthy body weight or lose weight. Ask  what the best weight range is for you. On most days of the week, get at least 30 minutes of exercise that causes your heart to beat faster. This may include walking, swimming, or biking. Work with your provider or dietitian to adjust your eating plan to meet your specific calorie needs. What foods should I eat? Fruits All fresh, dried, or frozen fruit. Canned fruits that are in their natural juice and do not have sugar added to them. Vegetables Fresh or frozen vegetables that are raw, steamed, roasted, or grilled. Low-sodium or reduced-sodium tomato and vegetable juice. Low-sodium or reduced-sodium tomato sauce  and tomato paste. Low-sodium or reduced-sodium canned vegetables. Grains Whole-grain or whole-wheat bread. Whole-grain or whole-wheat pasta. Brown rice. Mcneil Madeira. Bulgur. Whole-grain and low-sodium cereals. Pita bread. Low-fat, low-sodium crackers. Whole-wheat flour tortillas. Meats and other proteins Skinless chicken or turkey. Ground chicken or turkey. Pork with fat trimmed off. Fish and seafood. Egg whites. Dried beans, peas, or lentils. Unsalted nuts, nut butters, and seeds. Unsalted canned beans. Lean cuts of beef with fat trimmed off. Low-sodium, lean precooked or cured meat, such as sausages or meat loaves. Dairy Low-fat (1%) or fat-free (skim) milk. Reduced-fat, low-fat, or fat-free cheeses. Nonfat, low-sodium ricotta or cottage cheese. Low-fat or nonfat yogurt. Low-fat, low-sodium cheese. Fats and oils Soft margarine without trans fats. Vegetable oil. Reduced-fat, low-fat, or light mayonnaise and salad dressings (reduced-sodium). Canola, safflower, olive, avocado, soybean, and sunflower oils. Avocado. Seasonings and condiments Herbs. Spices. Seasoning mixes without salt. Other foods Unsalted popcorn and pretzels. Fat-free sweets. The items listed above may not be all the foods and drinks you can have. Talk to a dietitian to learn more. What foods should I avoid? Fruits Canned fruit in a light or heavy syrup. Fried fruit. Fruit in cream or butter sauce. Vegetables Creamed or fried vegetables. Vegetables in a cheese sauce. Regular canned vegetables that are not marked as low-sodium or reduced-sodium. Regular canned tomato sauce and paste that are not marked as low-sodium or reduced-sodium. Regular tomato and vegetable juices that are not marked as low-sodium or reduced-sodium. Dene. Olives. Grains Baked goods made with fat, such as croissants, muffins, or some breads. Dry pasta or rice meal packs. Meats and other proteins Fatty cuts of meat. Ribs. Fried meat. Aldona. Bologna,  salami, and other precooked or cured meats, such as sausages or meat loaves, that are not lean and low in sodium. Fat from the back of a pig (fatback). Bratwurst. Salted nuts and seeds. Canned beans with added salt. Canned or smoked fish. Whole eggs or egg yolks. Chicken or turkey with skin. Dairy Whole or 2% milk, cream, and half-and-half. Whole or full-fat cream cheese. Whole-fat or sweetened yogurt. Full-fat cheese. Nondairy creamers. Whipped toppings. Processed cheese and cheese spreads. Fats and oils Butter. Stick margarine. Lard. Shortening. Ghee. Bacon fat. Tropical oils, such as coconut, palm kernel, or palm oil. Seasonings and condiments Onion salt, garlic salt, seasoned salt, table salt, and sea salt. Worcestershire sauce. Tartar sauce. Barbecue sauce. Teriyaki sauce. Soy sauce, including reduced-sodium soy sauce. Steak sauce. Canned and packaged gravies. Fish sauce. Oyster sauce. Cocktail sauce. Store-bought horseradish. Ketchup. Mustard. Meat flavorings and tenderizers. Bouillon cubes. Hot sauces. Pre-made or packaged marinades. Pre-made or packaged taco seasonings. Relishes. Regular salad dressings. Other foods Salted popcorn and pretzels. The items listed above may not be all the foods and drinks you should avoid. Talk to a dietitian to learn more. Where to find more information National Heart, Lung, and Blood Institute (NHLBI): buffalodrycleaner.gl  American Heart Association (AHA): heart.org Academy of Nutrition and Dietetics: eatright.org National Kidney Foundation (NKF): kidney.org This information is not intended to replace advice given to you by your health care provider. Make sure you discuss any questions you have with your health care provider. Document Revised: 09/25/2022 Document Reviewed: 09/25/2022 Elsevier Patient Education  2024 [employer].

## 2024-09-07 NOTE — Progress Notes (Unsigned)
° °  Established Patient Office Visit  Subjective   Patient ID: Amanda Novak, female    DOB: [birth year]  Age: 68 y.o. MRN: [id number]  Chief Complaint  Patient presents with   Medical Management of Chronic Issues    HPI  {History (Optional):23778}  ROS    Objective:     BP (!) 150/63   Pulse 61   Ht 5' 4 (1.626 m)   Wt 164 lb (74.4 kg)   SpO2 99%   BMI 28.15 kg/m  {Vitals History (Optional):23777}  Physical Exam   No results found for any visits on 09/07/24.  {Labs (Optional):23779}  The 10-year ASCVD risk score (Arnett DK, et al., 2019) is: 9.3%    Assessment & Plan:   Problem List Items Addressed This Visit       Unprioritized   GERD (gastroesophageal reflux disease) - Primary (Chronic)   Elevated blood pressure reading    No follow-ups on file.    Saverio Kader, PA-C

## 2024-09-09 ENCOUNTER — Encounter: Payer: Self-pay | Admitting: Physician Assistant

## 2024-09-09 ENCOUNTER — Telehealth: Payer: Self-pay

## 2024-09-09 ENCOUNTER — Other Ambulatory Visit (HOSPITAL_COMMUNITY): Payer: Self-pay

## 2024-09-09 NOTE — Telephone Encounter (Signed)
 Pharmacy Patient Advocate Encounter   Received notification from Onbase that prior authorization for Cyclobenzaprine  HCl 5MG  tablets is required/requested.   Insurance verification completed.   The patient is insured through [employer].   Per test claim: PA required and submitted KEY/EOC/Request #: [id number] from 11/119/25 to 09/09/25. Ran test claim, Copay is $0.78. This test claim was processed through [facility]- copay amounts may vary at other pharmacies due to pharmacy/plan contracts, or as the patient moves through the different stages of their insurance plan.

## 2024-09-15 ENCOUNTER — Other Ambulatory Visit: Payer: Self-pay | Admitting: Physician Assistant

## 2024-09-26 NOTE — Progress Notes (Signed)
" ° °  Subjective:    Patient ID: Amanda Novak, female    DOB: Dec 12, 1955, 69 y.o.   MRN: [id number]  HPI  Patient is here for a 2 wk BP recheck. Per last OV with Amanda Novak pt follows a low sodium diet, similar to her husband's diet for congestive heart failure - Believes dietary changes may be affecting blood pressure and causing lightheadedness denies CP, SOB, headache, medication issues, or vision changes  Review of Systems     Objective:   Physical Exam        Assessment & Plan:   Patients first BP is 131/61. Amanda Novak is satisfied with these readings. Patient is also requesting a referral for a routine breast US  that she says she gets every 6 months. Her insurance recently changed. [place] perks this referral faxed to [phone]. Pt is to RTC PRN. "

## 2024-09-27 ENCOUNTER — Ambulatory Visit (INDEPENDENT_AMBULATORY_CARE_PROVIDER_SITE_OTHER): Payer: Medicare (Managed Care)

## 2024-09-27 ENCOUNTER — Telehealth: Payer: Self-pay | Admitting: Physician Assistant

## 2024-09-27 VITALS — BP 131/61 | HR 62 | Resp 18 | Ht 64.0 in | Wt 164.0 lb

## 2024-09-27 DIAGNOSIS — R030 Elevated blood-pressure reading, without diagnosis of hypertension: Secondary | ICD-10-CM | POA: Diagnosis not present

## 2024-09-27 DIAGNOSIS — N6009 Solitary cyst of unspecified breast: Secondary | ICD-10-CM

## 2024-09-27 NOTE — Telephone Encounter (Signed)
 Pt is requesting a Breast US  order and breast exam to go to [facility]

## 2024-09-27 NOTE — Telephone Encounter (Signed)
 Signed and handed to Tiffany to fax.

## 2024-10-03 ENCOUNTER — Telehealth: Payer: Self-pay | Admitting: Physician Assistant

## 2024-10-03 DIAGNOSIS — N6009 Solitary cyst of unspecified breast: Secondary | ICD-10-CM

## 2024-10-03 NOTE — Telephone Encounter (Signed)
 Note states call came in from [facility] - last message states US  Orders to be sent to [facility], Needing to verify which location patient is going to go to for exam Also will call to verify that order for diagnostic Mammogram is also needed when I call.   Attempted call to patient. Left a voicemail message requesting a return call.

## 2024-10-03 NOTE — Telephone Encounter (Unsigned)
 Copied from CRM #[id number]. Topic: Referral - Request for Referral >> Oct 03, 2024 10:09 AM Larissa RAMAN wrote: Did the patient discuss referral with their provider in the last year? Yes (If No - schedule appointment) (If Yes - send message)  Appointment offered? Yes  Type of order/referral and detailed reason for visit: 6 month f/u bilateral u/s of both breast LT diagnostic breast mammogram  Preference of office, provider, location: [facility] U/S department. Appointment scheduled for 02/25.   If referral order, have you been seen by this specialty before? Yes (If Yes, this issue or another issue? When? Where?  Can we respond through MyChart? No

## 2024-10-04 NOTE — Telephone Encounter (Signed)
 Spoke with [facility] imaging scheduling  Patient shows upcoming appt scheduled for Nov 17, 2024. Orders for imaging ( both diagnostic mmg and US  bilateral breast )  faxed to [phone] and confirmation page received.
